# Patient Record
Sex: Female | Born: 1995
Health system: Southern US, Community
[De-identification: ages and names within clinical notes are randomized; demographics above are authoritative.]

## PROBLEM LIST (undated history)

## (undated) DIAGNOSIS — E282 Polycystic ovarian syndrome: Secondary | ICD-10-CM

## (undated) DIAGNOSIS — D649 Anemia, unspecified: Secondary | ICD-10-CM

## (undated) DIAGNOSIS — E079 Disorder of thyroid, unspecified: Secondary | ICD-10-CM

## (undated) DIAGNOSIS — E039 Hypothyroidism, unspecified: Secondary | ICD-10-CM

## (undated) HISTORY — DX: Disorder of thyroid, unspecified: E07.9

## (undated) HISTORY — DX: Polycystic ovarian syndrome: E28.2

## (undated) HISTORY — DX: Anemia, unspecified: D64.9

## (undated) HISTORY — PX: WISDOM TOOTH EXTRACTION: SHX21

---
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
## 2017-05-17 ENCOUNTER — Ambulatory Visit (INDEPENDENT_AMBULATORY_CARE_PROVIDER_SITE_OTHER): Payer: No Typology Code available for payment source | Admitting: Family Medicine

## 2017-05-17 ENCOUNTER — Encounter: Payer: Self-pay | Admitting: Family Medicine

## 2017-05-17 VITALS — BP 108/70 | HR 78 | Temp 98.4°F | Ht 64.0 in | Wt 192.2 lb

## 2017-05-17 DIAGNOSIS — Z114 Encounter for screening for human immunodeficiency virus [HIV]: Secondary | ICD-10-CM | POA: Diagnosis not present

## 2017-05-17 DIAGNOSIS — Z0000 Encounter for general adult medical examination without abnormal findings: Secondary | ICD-10-CM | POA: Diagnosis not present

## 2017-05-17 DIAGNOSIS — Z111 Encounter for screening for respiratory tuberculosis: Secondary | ICD-10-CM

## 2017-05-17 DIAGNOSIS — E039 Hypothyroidism, unspecified: Secondary | ICD-10-CM | POA: Diagnosis not present

## 2017-05-17 DIAGNOSIS — Z23 Encounter for immunization: Secondary | ICD-10-CM | POA: Diagnosis not present

## 2017-05-17 MED ORDER — DROSPIRENONE-ETHINYL ESTRADIOL 3-0.02 MG PO TABS: 1.0000 | ORAL_TABLET | Freq: Every day | ORAL | 3 refills | Status: DC

## 2017-05-17 NOTE — Progress Notes (Signed)
Pre visit review using our clinic review tool, if applicable. No additional management support is needed unless otherwise documented below in the visit note. 

## 2017-05-17 NOTE — Progress Notes (Signed)
Chief Complaint  Patient presents with  . Establish Care     Well Woman [place] Leah Howard is here for a complete physical.   Her last physical was >1 year ago.  Current diet: in general, a "healthy" diet. Current exercise: cardio, lifting. Weight is stable and she denies daytime fatigue. No LMP recorded.  Seatbelt? Yes  Health Maintenance Pap/HPV- Yes Tetanus- No HIV screening- No  STI- 1 year ago  Needs tb test for school.  Past Medical History:  Diagnosis Date  . PCOS (polycystic ovarian syndrome)      History reviewed. No pertinent surgical history.  Medications  Current Outpatient Medications on File Prior to Visit  Medication Sig Dispense Refill  . drospirenone-ethinyl estradiol (YAZ,GIANVI,LORYNA) 3-0.02 MG tablet Take 1 tablet by mouth daily.     Allergies No Known Allergies  Review of Systems: Constitutional:  no unexpected weight changes Eye:  no recent significant change in vision Ear/Nose/Mouth/Throat:  Ears:  no tinnitus or vertigo and no recent change in hearing Nose/Mouth/Throat:  no complaints of nasal congestion, no sore throat Cardiovascular: no chest pain Respiratory:  no cough and no shortness of breath Gastrointestinal:  no abdominal pain, no change in bowel habits GU:  Female: negative for dysuria or pelvic pain Musculoskeletal/Extremities: +L knee pain; otherwise no pain of the joints Integumentary (Skin/Breast):  no abnormal skin lesions reported Neurologic:  no headaches Endocrine:  denies fatigue Hematologic/Lymphatic:  No areas of easy bleeding  Exam BP 108/70 (BP Location: Left Arm, Patient Position: Sitting, Cuff Size: Normal)   Pulse 78   Temp 98.4 F (36.9 C) (Oral)   Ht  (1.626 m)   Wt 192 lb 4 oz (87.2 kg)   SpO2 98%   BMI 33.00 kg/m  General:  well developed, well nourished, in no apparent distress Skin:  no significant moles, warts, or growths Head:  no masses, lesions, or tenderness Eyes:  pupils equal and round,  sclera anicteric without injection Ears:  canals without lesions, TMs shiny without retraction, no obvious effusion, no erythema Nose:  nares patent, septum midline, mucosa normal, and no drainage or sinus tenderness Throat/Pharynx:  lips and gingiva without lesion; tongue and uvula midline; non-inflamed pharynx; no exudates or postnasal drainage Neck: neck supple without adenopathy, thyromegaly, or masses Lungs:  clear to auscultation, breath sounds equal bilaterally, no respiratory distress Cardio:  regular rate and rhythm, no bruits, no LE edema Abdomen:  abdomen soft, nontender; bowel sounds normal; no masses or organomegaly Genital: Defer to GYN Musculoskeletal:  symmetrical muscle groups noted without atrophy or deformity Extremities:  no clubbing, cyanosis, or edema, no deformities, no skin discoloration Neuro:  gait normal; deep tendon reflexes normal and symmetric Psych: well oriented with normal range of affect and appropriate judgment/insight  Assessment and Plan  Well adult exam - Plan: CBC, Comprehensive metabolic panel, Lipid panel  Screening-pulmonary TB - Plan: QuantiFERON-TB Gold Plus  Screening for HIV (human immunodeficiency virus) - Plan: HIV antibody  Hypothyroidism, unspecified type - Plan: TSH, T4, free  Need for tetanus booster - Plan: Tdap vaccine greater than or equal to 7yo IM   Well 22 y.o. female. Counseled on diet and exercise. She follows with GYN for her women's health. Will have STI screening through them. Other orders as above. Follow up in 1 yr or prn pending above. The patient voiced understanding and agreement to the plan.  Jilda Roche [place], DO 05/17/17 5:07 PM

## 2017-05-17 NOTE — Patient Instructions (Signed)
Keep the diet clean.  Stay physically active.  Come to your labs in 3 weeks fasting.   We will be in touch regarding the results.   If you need a refill of the thyroid medication, let me know.   Let us know if you need anything.

## 2017-05-18 ENCOUNTER — Other Ambulatory Visit: Payer: Self-pay | Admitting: Family Medicine

## 2017-05-19 ENCOUNTER — Encounter: Payer: Self-pay | Admitting: Family Medicine

## 2017-05-19 LAB — QUANTIFERON-TB GOLD PLUS
Mitogen-NIL: >10 [IU]/mL
NIL: 0.03 [IU]/mL
QuantiFERON-TB Gold Plus: NEGATIVE
TB1-NIL: <0 [IU]/mL
TB2-NIL: 0.01 [IU]/mL

## 2017-05-22 ENCOUNTER — Other Ambulatory Visit: Payer: Self-pay | Admitting: Family Medicine

## 2017-05-23 ENCOUNTER — Other Ambulatory Visit: Payer: Self-pay | Admitting: Family Medicine

## 2017-05-24 MED ORDER — LEVOTHYROXINE SODIUM 25 MCG PO TABS: 25.0000 ug | ORAL_TABLET | Freq: Every day | ORAL | 1 refills | Status: DC

## 2017-05-24 NOTE — Telephone Encounter (Signed)
Called and informed the patient of her results/she will pickup copy in the morning at our office.

## 2017-05-24 NOTE — Telephone Encounter (Signed)
Copied from CRM [id number]. Topic: Quick Communication - Other Results >> May 24, 2017  1:27 PM Leafy Ro wrote: Pt is calling would like tb blood test results

## 2017-05-24 NOTE — Telephone Encounter (Signed)
Called left message to call back 

## 2017-05-24 NOTE — Telephone Encounter (Signed)
Copied from CRM #[id number]. Topic: Quick Communication - Other Results >> May 24, 2017  1:27 PM Robinson, Norma J wrote: Pt is calling would like tb blood test results 

## 2017-05-24 NOTE — Telephone Encounter (Signed)
Negative/normal. TY.

## 2017-06-08 ENCOUNTER — Encounter: Payer: Self-pay | Admitting: Family Medicine

## 2017-06-08 ENCOUNTER — Other Ambulatory Visit (INDEPENDENT_AMBULATORY_CARE_PROVIDER_SITE_OTHER): Payer: No Typology Code available for payment source

## 2017-06-08 ENCOUNTER — Other Ambulatory Visit: Payer: No Typology Code available for payment source

## 2017-06-08 DIAGNOSIS — Z114 Encounter for screening for human immunodeficiency virus [HIV]: Secondary | ICD-10-CM

## 2017-06-08 DIAGNOSIS — Z0000 Encounter for general adult medical examination without abnormal findings: Secondary | ICD-10-CM | POA: Diagnosis not present

## 2017-06-08 DIAGNOSIS — E039 Hypothyroidism, unspecified: Secondary | ICD-10-CM | POA: Diagnosis not present

## 2017-06-08 LAB — TSH: TSH: 6.9 u[IU]/mL — ABNORMAL HIGH (ref 0.35–4.50)

## 2017-06-08 LAB — COMPREHENSIVE METABOLIC PANEL WITH GFR
ALT: 17 U/L (ref 0–35)
AST: 21 U/L (ref 0–37)
Albumin: 4.3 g/dL (ref 3.5–5.2)
Alkaline Phosphatase: 46 U/L (ref 39–117)
BUN: 13 mg/dL (ref 6–23)
CO2: 25 meq/L (ref 19–32)
Calcium: 9.2 mg/dL (ref 8.4–10.5)
Chloride: 105 meq/L (ref 96–112)
Creatinine, Ser: 0.72 mg/dL (ref 0.40–1.20)
GFR: 107.08 mL/min (ref >60.00)
Glucose, Bld: 89 mg/dL (ref 70–99)
Potassium: 4.5 meq/L (ref 3.5–5.1)
Sodium: 139 meq/L (ref 135–145)
Total Bilirubin: 0.3 mg/dL (ref 0.2–1.2)
Total Protein: 7.2 g/dL (ref 6.0–8.3)

## 2017-06-08 LAB — LIPID PANEL
Cholesterol: 170 mg/dL (ref 0–200)
HDL: 73.7 mg/dL (ref >39.00)
LDL Cholesterol: 71 mg/dL (ref 0–99)
NonHDL: 95.96
Total CHOL/HDL Ratio: 2
Triglycerides: 123 mg/dL (ref 0.0–149.0)
VLDL: 24.6 mg/dL (ref 0.0–40.0)

## 2017-06-08 LAB — CBC
HCT: 36.7 % (ref 36.0–46.0)
Hemoglobin: 12.5 g/dL (ref 12.0–15.0)
MCHC: 34.2 g/dL (ref 30.0–36.0)
MCV: 84.2 fl (ref 78.0–100.0)
Platelets: 204 K/uL (ref 150.0–400.0)
RBC: 4.36 Mil/uL (ref 3.87–5.11)
RDW: 12.7 % (ref 11.5–15.5)
WBC: 7.3 K/uL (ref 4.0–10.5)

## 2017-06-08 LAB — T4, FREE: Free T4: 0.72 ng/dL (ref 0.60–1.60)

## 2017-06-08 LAB — HIV ANTIBODY (ROUTINE TESTING W REFLEX): HIV 1&2 Ab, 4th Generation: NONREACTIVE

## 2017-06-09 ENCOUNTER — Encounter: Payer: Self-pay | Admitting: Family Medicine

## 2017-06-18 ENCOUNTER — Encounter: Payer: Self-pay | Admitting: Obstetrics and Gynecology

## 2017-06-18 ENCOUNTER — Ambulatory Visit (INDEPENDENT_AMBULATORY_CARE_PROVIDER_SITE_OTHER): Payer: No Typology Code available for payment source | Admitting: Obstetrics and Gynecology

## 2017-06-18 VITALS — BP 119/64 | HR 72 | Ht 64.0 in | Wt 193.0 lb

## 2017-06-18 DIAGNOSIS — E282 Polycystic ovarian syndrome: Secondary | ICD-10-CM | POA: Diagnosis not present

## 2017-06-18 DIAGNOSIS — Z124 Encounter for screening for malignant neoplasm of cervix: Secondary | ICD-10-CM

## 2017-06-18 DIAGNOSIS — Z01419 Encounter for gynecological examination (general) (routine) without abnormal findings: Secondary | ICD-10-CM | POA: Diagnosis not present

## 2017-06-18 DIAGNOSIS — Z113 Encounter for screening for infections with a predominantly sexual mode of transmission: Secondary | ICD-10-CM

## 2017-06-18 DIAGNOSIS — Z3189 Encounter for other procreative management: Secondary | ICD-10-CM | POA: Diagnosis not present

## 2017-06-18 NOTE — Progress Notes (Signed)
GYNECOLOGY ANNUAL PREVENTATIVE CARE ENCOUNTER NOTE  Subjective:   Leah Howard is a 22 y.o. G0P0000 female here for a annual gynecologic exam. Current complaints: wondering how her contraception affects her PCOS, would like to discuss fertility planning.  Having monthly periods, bleeds 3-4 days, very light.  Denies  discharge, pelvic pain, or other gynecologic concerns. She would like to conceive in 1-2 years.   Gynecologic History Patient's last menstrual period was 05/25/2017. Contraception: OCP (estrogen/progesterone) Last Pap: 2 years ago. Results were: normal Last mammogram: n/a  Obstetric History OB History  Gravida Para Term Preterm AB Living  0 0 0 0 0 0  SAB TAB Ectopic Multiple Live Births  0 0 0 0 0    Past Medical History:  Diagnosis Date  . Anemia   . PCOS (polycystic ovarian syndrome)   . PCOS (polycystic ovarian syndrome)   . Thyroid disease     Past Surgical History:  Procedure Laterality Date  . WISDOM TOOTH EXTRACTION      Current Outpatient Medications on File Prior to Visit  Medication Sig Dispense Refill  . drospirenone-ethinyl estradiol (YAZ,GIANVI,LORYNA) 3-0.02 MG tablet Take 1 tablet by mouth daily. 3 Package 3  . levothyroxine (SYNTHROID, LEVOTHROID) 25 MCG tablet Take 1 tablet (25 mcg total) by mouth daily before breakfast. 90 tablet 1   No current facility-administered medications on file prior to visit.     No Known Allergies  Social History   Socioeconomic History  . Marital status: Single    Spouse name: Not on file  . Number of children: Not on file  . Years of education: Not on file  . Highest education level: Not on file  Occupational History  . Not on file  Social Needs  . Financial resource strain: Not on file  . Food insecurity:    Worry: Not on file    Inability: Not on file  . Transportation needs:    Medical: Not on file    Non-medical: Not on file  Tobacco Use  . Smoking status: Never Smoker  . Smokeless  tobacco: Never Used  Substance and Sexual Activity  . Alcohol use: Never    Frequency: Never  . Drug use: Never  . Sexual activity: Yes    Birth control/protection: Pill  Lifestyle  . Physical activity:    Days per week: Not on file    Minutes per session: Not on file  . Stress: Not on file  Relationships  . Social connections:    Talks on phone: Not on file    Gets together: Not on file    Attends religious service: Not on file    Active member of club or organization: Not on file    Attends meetings of clubs or organizations: Not on file    Relationship status: Not on file  . Intimate partner violence:    Fear of current or ex partner: Not on file    Emotionally abused: Not on file    Physically abused: Not on file    Forced sexual activity: Not on file  Other Topics Concern  . Not on file  Social History Narrative  . Not on file    Family History  Problem Relation Age of Onset  . Hyperlipidemia Father   . Diabetes Paternal Grandmother   . Arthritis Paternal Grandfather   . Diabetes Paternal Grandfather     Diet: improved, patient trying to lose weight Exercise: does exercise classes 3x/week at gym  The following  portions of the patient's history were reviewed and updated as appropriate: allergies, current medications, past family history, past medical history, past social history, past surgical history and problem list.  Review of Systems Pertinent items are noted in HPI.   Objective:  BP 119/64   Pulse 72   Ht 5\' 4"  (1.626 m)   Wt 193 lb (87.5 kg)   LMP 05/25/2017   BMI 33.13 kg/m  CONSTITUTIONAL: Well-developed, well-nourished female in no acute distress.  HENT:  Normocephalic, atraumatic, External right and left ear normal. Oropharynx is clear and moist EYES: Conjunctivae and EOM are normal. Pupils are equal, round, and reactive to light. No scleral icterus.  NECK: Normal range of motion, supple, no masses.  SKIN: Skin is warm and dry. No rash noted.  Not diaphoretic. No erythema. No pallor. NEUROLOGIC: Alert and oriented to person, place, and time. Normal reflexes, muscle tone coordination. No cranial nerve deficit noted. PSYCHIATRIC: Normal mood and affect. Normal behavior. Normal judgment and thought content. CARDIOVASCULAR: Normal heart rate noted, regular rhythm RESPIRATORY: Clear to auscultation bilaterally. Effort and breath sounds normal, no problems with respiration noted. BREASTS: Symmetric in size. No masses, skin changes, nipple drainage, or lymphadenopathy. ABDOMEN: Soft, normal bowel sounds, no distention noted.  No tenderness, rebound or guarding.  PELVIC: Normal appearing external genitalia; normal appearing vaginal mucosa and cervix.  No abnormal discharge noted.  Pap smear obtained.  Normal uterine size, no other palpable masses, no uterine or adnexal tenderness. MUSCULOSKELETAL: Normal range of motion. No tenderness.  No cyanosis, clubbing, or edema.  2+ distal pulses.   Assessment and Plan:   1. Well woman exam Routine exam - Cytology - PAP  2. PCOS (polycystic ovarian syndrome) Cont CHC until attempts to conceive, then may dc Reviewed it may take longer for her to achieve pregnancy and once she starts trying, if no conception within 1 year, to seek medical assistance  3. Encounter for fertility planning Reviewed importance of prenatal vitamins, avoiding alcohol, tobacco products Reviewed importance of maintaining healthy weight, she is actively attempting to lose weight, has done well and lost 6 pounds since beginning of year, encouraged her to continue   Will follow up results of pap smear/STI screen and manage accordingly. Encouraged improvement in diet and exercise.    Routine preventative health maintenance measures emphasized. Please refer to After Visit Summary for other counseling recommendations.     Baldemar LenisK. Meryl Raye Wiens, M.D. Attending Obstetrician & Gynecologist, [facility], [facility]

## 2017-06-21 LAB — CYTOLOGY - PAP
Chlamydia: NEGATIVE
Diagnosis: NEGATIVE
Neisseria Gonorrhea: NEGATIVE

## 2017-06-23 MED FILL — LEVOTHYROXINE 25 MCG TABLET: 25 | 90 days supply | Qty: 90 | Fill #0

## 2017-07-23 ENCOUNTER — Encounter: Payer: Self-pay | Admitting: Obstetrics and Gynecology

## 2017-09-27 ENCOUNTER — Ambulatory Visit (INDEPENDENT_AMBULATORY_CARE_PROVIDER_SITE_OTHER): Payer: No Typology Code available for payment source

## 2017-09-27 DIAGNOSIS — Z23 Encounter for immunization: Secondary | ICD-10-CM

## 2017-12-14 ENCOUNTER — Ambulatory Visit (INDEPENDENT_AMBULATORY_CARE_PROVIDER_SITE_OTHER): Payer: No Typology Code available for payment source | Admitting: Obstetrics & Gynecology

## 2017-12-14 ENCOUNTER — Encounter: Payer: Self-pay | Admitting: Obstetrics & Gynecology

## 2017-12-14 VITALS — BP 125/77 | HR 80 | Wt 187.1 lb

## 2017-12-14 DIAGNOSIS — Z8742 Personal history of other diseases of the female genital tract: Secondary | ICD-10-CM

## 2017-12-14 DIAGNOSIS — Z3400 Encounter for supervision of normal first pregnancy, unspecified trimester: Secondary | ICD-10-CM

## 2017-12-14 DIAGNOSIS — Z3401 Encounter for supervision of normal first pregnancy, first trimester: Secondary | ICD-10-CM | POA: Diagnosis not present

## 2017-12-14 DIAGNOSIS — Z113 Encounter for screening for infections with a predominantly sexual mode of transmission: Secondary | ICD-10-CM

## 2017-12-14 LAB — POCT URINALYSIS DIPSTICK OB
Bilirubin, UA: NEGATIVE
Blood, UA: NEGATIVE
Glucose, UA: NEGATIVE
Ketones, UA: NEGATIVE
Leukocytes, UA: NEGATIVE
Nitrite, UA: NEGATIVE
POC,PROTEIN,UA: NEGATIVE
Spec Grav, UA: 1.015 (ref 1.010–1.025)
pH, UA: 6.5 (ref 5.0–8.0)

## 2017-12-14 NOTE — Patient Instructions (Signed)
First Trimester of Pregnancy The first trimester of pregnancy is from week 1 until the end of week 13 (months 1 through 3). During this time, your baby will begin to develop inside you. At 6-8 weeks, the eyes and face are formed, and the heartbeat can be seen on ultrasound. At the end of 12 weeks, all the baby's organs are formed. Prenatal care is all the medical care you receive before the birth of your baby. Make sure you get good prenatal care and follow all of your doctor's instructions. Follow these instructions at home: Medicines  Take over-the-counter and prescription medicines only as told by your doctor. Some medicines are safe and some medicines are not safe during pregnancy.  Take a prenatal vitamin that contains at least 600 micrograms (mcg) of folic acid.  If you have trouble pooping (constipation), take medicine that will make your stool soft (stool softener) if your doctor approves. Eating and drinking  Eat regular, healthy meals.  Your doctor will tell you the amount of weight gain that is right for you.  Avoid raw meat and uncooked cheese.  If you feel sick to your stomach (nauseous) or throw up (vomit): ? Eat 4 or 5 small meals a day instead of 3 large meals. ? Try eating a few soda crackers. ? Drink liquids between meals instead of during meals.  To prevent constipation: ? Eat foods that are high in fiber, like fresh fruits and vegetables, whole grains, and beans. ? Drink enough fluids to keep your pee (urine) clear or pale yellow. Activity  Exercise only as told by your doctor. Stop exercising if you have cramps or pain in your lower belly (abdomen) or low back.  Do not exercise if it is too hot, too humid, or if you are in a place of great height (high altitude).  Try to avoid standing for long periods of time. Move your legs often if you must stand in one place for a long time.  Avoid heavy lifting.  Wear low-heeled shoes. Sit and stand up straight.  You  can have sex unless your doctor tells you not to. Relieving pain and discomfort  Wear a good support bra if your breasts are sore.  Take warm water baths (sitz baths) to soothe pain or discomfort caused by hemorrhoids. Use hemorrhoid cream if your doctor says it is okay.  Rest with your legs raised if you have leg cramps or low back pain.  If you have puffy, bulging veins (varicose veins) in your legs: ? Wear support hose or compression stockings as told by your doctor. ? Raise (elevate) your feet for 15 minutes, 3-4 times a day. ? Limit salt in your food. Prenatal care  Schedule your prenatal visits by the twelfth week of pregnancy.  Write down your questions. Take them to your prenatal visits.  Keep all your prenatal visits as told by your doctor. This is important. Safety  Wear your seat belt at all times when driving.  Make a list of emergency phone numbers. The list should include numbers for family, friends, the hospital, and police and fire departments. General instructions  Ask your doctor for a referral to a local prenatal class. Begin classes no later than at the start of month 6 of your pregnancy.  Ask for help if you need counseling or if you need help with nutrition. Your doctor can give you advice or tell you where to go for help.  Do not use hot tubs, steam rooms, or   saunas.  Do not douche or use tampons or scented sanitary pads.  Do not cross your legs for long periods of time.  Avoid all herbs and alcohol. Avoid drugs that are not approved by your doctor.  Do not use any tobacco products, including cigarettes, chewing tobacco, and electronic cigarettes. If you need help quitting, ask your doctor. You may get counseling or other support to help you quit.  Avoid cat litter boxes and soil used by cats. These carry germs that can cause birth defects in the baby and can cause a loss of your baby (miscarriage) or stillbirth.  Visit your dentist. At home, brush  your teeth with a soft toothbrush. Be gentle when you floss. Contact a doctor if:  You are dizzy.  You have mild cramps or pressure in your lower belly.  You have a nagging pain in your belly area.  You continue to feel sick to your stomach, you throw up, or you have watery poop (diarrhea).  You have a bad smelling fluid coming from your vagina.  You have pain when you pee (urinate).  You have increased puffiness (swelling) in your face, hands, legs, or ankles. Get help right away if:  You have a fever.  You are leaking fluid from your vagina.  You have spotting or bleeding from your vagina.  You have very bad belly cramping or pain.  You gain or lose weight rapidly.  You throw up blood. It may look like coffee grounds.  You are around people who have German measles, fifth disease, or chickenpox.  You have a very bad headache.  You have shortness of breath.  You have any kind of trauma, such as from a fall or a car accident. Summary  The first trimester of pregnancy is from week 1 until the end of week 13 (months 1 through 3).  To take care of yourself and your unborn baby, you will need to eat healthy meals, take medicines only if your doctor tells you to do so, and do activities that are safe for you and your baby.  Keep all follow-up visits as told by your doctor. This is important as your doctor will have to ensure that your baby is healthy and growing well. This information is not intended to replace advice given to you by your health care provider. Make sure you discuss any questions you have with your health care provider. Document Released: 06/17/2007 Document Revised: 01/07/2016 Document Reviewed: 01/07/2016 Elsevier Interactive Patient Education  2017 [employer]  

## 2017-12-14 NOTE — Progress Notes (Signed)
DATING AND VIABILITY SONOGRAM   Leah Howard is a 22 y.o. year old G1P0000 with LMP Patient's last menstrual period was 10/14/2017 (exact date). which would correlate to  5262w5d weeks gestation.  She has regular menstrual cycles.   She is here today for a confirmatory initial sonogram.    GESTATION: SINGLETON     FETAL ACTIVITY:          Heart rate      156          The fetus is active.    ADNEXA: The ovaries are normal.   GESTATIONAL AGE AND  BIOMETRICS:  Gestational criteria: Estimated Date of Delivery: 07/21/18 by LMP now at 9762w5d  Previous Scans:0  GESTATIONAL SAC           3.24 cm         8-3 weeks  CROWN RUMP LENGTH           1.93 cm        8-3 weeks                                                                               AVERAGE EGA(BY THIS SCAN): 8-3 weeks  WORKING EDD( early ultrasound ):  07-21-2018     TECHNICIAN COMMENTS: Patient informed that the ultrasound is considered a limited obstetric ultrasound and is not intended to be a complete ultrasound exam. Patient also informed that the ultrasound is not being completed with the intent of assessing for fetal or placental anomalies or any pelvic abnormalities. Explained that the purpose of today's ultrasound is to assess for fetal heart rate. Patient acknowledges the purpose of the exam and the limitations of the study.   Armandina StammerJennifer Seann Genther 12/14/2017 9:19 AM

## 2017-12-14 NOTE — Progress Notes (Signed)
  Subjective:    Leah Howard is being seen today for her first obstetrical visit.LMP 10-14-2017. She was on OCPs until Sept and and Levothyroxine until July to regulate her cycles. Has reg cycles after stopping meds.   This is a planned pregnancy. She is at 4932w5d gestation. Her obstetrical history is significant for PCOS. Relationship with FOB: significant other, living together. Patient does intend to breast feed. Pregnancy history fully reviewed.  Patient reports nausea and mild nausea. Mild constipation.  .  Review of Systems:   Review of Systems  Objective:     BP 125/77   Pulse 80   Wt 187 lb 1.3 oz (84.9 kg)   LMP 10/14/2017 (Exact Date)   BMI 32.11 kg/m  Physical Exam  Exam General Appearance:    Alert, cooperative, no distress, appears stated age  Head:    Normocephalic, without obvious abnormality, atraumatic  Eyes:    conjunctiva/corneas clear, EOM's intact, both eyes  Ears:    Normal external ear canals, both ears  Nose:   Nares normal, septum midline, mucosa normal, no drainage    or sinus tenderness  Throat:   Lips, mucosa, and tongue normal; teeth and gums normal  Neck:   Supple, symmetrical, trachea midline, no adenopathy;    thyroid:  no enlargement/tenderness/nodules  Back:     Symmetric, no curvature, ROM normal, no CVA tenderness  Lungs:     Clear to auscultation bilaterally, respirations unlabored  Chest Wall:    No tenderness or deformity   Heart:    Regular rate and rhythm, S1 and S2 normal, no murmur, rub   or gallop  Breast Exam:    No tenderness, masses, or nipple abnormality  Abdomen:     Soft, non-tender, bowel sounds active all four quadrants,    no masses, no organomegaly  Genitalia:    Normal female without lesion, discharge or tenderness; 8-9 weeks sized     Extremities:   Extremities normal, atraumatic, no cyanosis or edema  Pulses:   2+ and symmetric all extremities  Skin:   Skin color, texture, turgor normal, no rashes or lesions    Assessment:    Pregnancy: G1P0000 Patient Active Problem List   Diagnosis Date Noted  . Supervision of normal first pregnancy, antepartum 12/14/2017  h/o PCOS     Plan:     Initial labs drawn. Prenatal vitamins. Problem list reviewed and updated. AFP3 discussed: requested. Role of ultrasound in pregnancy discussed; fetal survey: requested. Amniocentesis discussed: not indicated. Follow up in 4 weeks. 60% of 40 min visit spent on counseling and coordination of care.    Leah RosenthalCarolyn Howard 12/14/2017

## 2017-12-15 LAB — GC/CHLAMYDIA PROBE AMP (~~LOC~~) NOT AT ARMC
Chlamydia: NEGATIVE
Neisseria Gonorrhea: NEGATIVE

## 2017-12-16 LAB — CULTURE, OB URINE

## 2017-12-16 LAB — URINE CULTURE, OB REFLEX: Organism ID, Bacteria: NO GROWTH

## 2017-12-22 LAB — OBSTETRIC PANEL, INCLUDING HIV
Antibody Screen: NEGATIVE
Basophils Absolute: 0 x10E3/uL (ref 0.0–0.2)
Basos: 0 %
EOS (ABSOLUTE): 0.1 x10E3/uL (ref 0.0–0.4)
Eos: 1 %
HIV Screen 4th Generation wRfx: NONREACTIVE
Hematocrit: 36.2 % (ref 34.0–46.6)
Hemoglobin: 12.4 g/dL (ref 11.1–15.9)
Hepatitis B Surface Ag: NEGATIVE
Immature Grans (Abs): 0.1 x10E3/uL (ref 0.0–0.1)
Immature Granulocytes: 1 %
Lymphocytes Absolute: 2.1 x10E3/uL (ref 0.7–3.1)
Lymphs: 20 %
MCH: 28.2 pg (ref 26.6–33.0)
MCHC: 34.3 g/dL (ref 31.5–35.7)
MCV: 83 fL (ref 79–97)
Monocytes Absolute: 0.6 x10E3/uL (ref 0.1–0.9)
Monocytes: 6 %
Neutrophils Absolute: 7.7 x10E3/uL — ABNORMAL HIGH (ref 1.4–7.0)
Neutrophils: 72 %
Platelets: 206 x10E3/uL (ref 150–450)
RBC: 4.39 x10E6/uL (ref 3.77–5.28)
RDW: 12.6 % (ref 12.3–15.4)
RPR Ser Ql: NONREACTIVE
Rh Factor: POSITIVE
Rubella Antibodies, IGG: 1 {index} (ref >0.99)
WBC: 10.6 x10E3/uL (ref 3.4–10.8)

## 2017-12-22 LAB — SMN1 COPY NUMBER ANALYSIS (SMA CARRIER SCREENING)

## 2017-12-22 LAB — HEMOGLOBINOPATHY EVALUATION
Ferritin: 41 ng/mL (ref 15–150)
Hgb A2 Quant: 2.5 % (ref 1.8–3.2)
Hgb A: 97.5 % (ref 96.4–98.8)
Hgb C: 0 %
Hgb F Quant: 0 % (ref 0.0–2.0)
Hgb S: 0 %
Hgb Solubility: NEGATIVE
Hgb Variant: 0 %

## 2018-01-11 ENCOUNTER — Ambulatory Visit (INDEPENDENT_AMBULATORY_CARE_PROVIDER_SITE_OTHER): Payer: No Typology Code available for payment source | Admitting: Obstetrics and Gynecology

## 2018-01-11 VITALS — BP 127/75 | HR 92 | Wt 189.0 lb

## 2018-01-11 DIAGNOSIS — Z8742 Personal history of other diseases of the female genital tract: Secondary | ICD-10-CM

## 2018-01-11 DIAGNOSIS — Z3401 Encounter for supervision of normal first pregnancy, first trimester: Secondary | ICD-10-CM

## 2018-01-11 DIAGNOSIS — Z3400 Encounter for supervision of normal first pregnancy, unspecified trimester: Secondary | ICD-10-CM

## 2018-01-11 NOTE — Progress Notes (Signed)
   PRENATAL VISIT NOTE  Subjective:  Leah Howard is a 22 y.o. G1P0000 at 3550w5d being seen today for ongoing prenatal care.  She is currently monitored for the following issues for this low-risk pregnancy and has Supervision of normal first pregnancy, antepartum on their problem list.  Patient reports nausea. No complaints of H/A, but wants to know what she can take, if she were to get a H/A.  Contractions: Not present. Vag. Bleeding: None.   . Denies leaking of fluid.   The following portions of the patient's history were reviewed and updated as appropriate: allergies, current medications, past family history, past medical history, past social history, past surgical history and problem list. Problem list updated.  Objective:   Vitals:   01/11/18 0830  BP: 127/75  Pulse: 92  Weight: 189 lb (85.7 kg)    Fetal Status: Fetal Heart Rate (bpm): 158 S=D       General:  Alert, oriented and cooperative. Patient is in no acute distress.  Skin: Skin is warm and dry. No rash noted.   Cardiovascular: Normal heart rate noted  Respiratory: Normal respiratory effort, no problems with respiration noted  Abdomen: Soft, gravid, appropriate for gestational age.  Pain/Pressure: Absent     Pelvic: Cervical exam deferred        Extremities: Normal range of motion.  Edema: None  Mental Status: Normal mood and affect. Normal behavior. Normal judgment and thought content.   Assessment and Plan:  Pregnancy: G1P0000 at 5750w5d  1. Supervision of normal first pregnancy, antepartum - Discussed it is safe to take Tylenol 1000 mg po every 6 hrs prn pain - Advised that if H/A begin to be a problem and minimal relief achieved with Tylenol, let provider know so we can Rx something specific for H/A and stronger than Tylenol - US MFM OB DETAIL +14 WK; Future in 6 wks  2. History of PCOS - US MFM OB DETAIL +14 WK; Future  Preterm labor symptoms and general obstetric precautions including but not limited to  vaginal bleeding, contractions, leaking of fluid and fetal movement were reviewed in detail with the patient. Please refer to After Visit Summary for other counseling recommendations.  Return in about 4 weeks (around 02/08/2018) for Return OB visit.  Future Appointments  Date Time Provider Department Center  02/14/2018  8:15 AM Levie HeritageStinson, Jacob J, DO [facility] None  05/20/2018  8:00 AM Wendling, Jilda RocheNicholas Paul, DO [facility]    Raelyn Moraolitta Amanda Pote, CNM

## 2018-02-14 ENCOUNTER — Ambulatory Visit (INDEPENDENT_AMBULATORY_CARE_PROVIDER_SITE_OTHER): Payer: No Typology Code available for payment source | Admitting: Family Medicine

## 2018-02-14 VITALS — BP 125/65 | HR 85 | Wt 199.0 lb

## 2018-02-14 DIAGNOSIS — E039 Hypothyroidism, unspecified: Secondary | ICD-10-CM

## 2018-02-14 DIAGNOSIS — Z3402 Encounter for supervision of normal first pregnancy, second trimester: Secondary | ICD-10-CM

## 2018-02-14 DIAGNOSIS — E038 Other specified hypothyroidism: Secondary | ICD-10-CM

## 2018-02-14 DIAGNOSIS — Z3400 Encounter for supervision of normal first pregnancy, unspecified trimester: Secondary | ICD-10-CM

## 2018-02-14 NOTE — Progress Notes (Signed)
   PRENATAL VISIT NOTE  Subjective:  Leah Howard is a 23 y.o. G1P0000 at 4527w4d being seen today for ongoing prenatal care.  She is currently monitored for the following issues for this low-risk pregnancy and has Supervision of normal first pregnancy, antepartum on their problem list.  Patient reports no complaints.  Contractions: Not present. Vag. Bleeding: None.  Movement: Present. Denies leaking of fluid.   The following portions of the patient's history were reviewed and updated as appropriate: allergies, current medications, past family history, past medical history, past social history, past surgical history and problem list. Problem list updated.  Objective:   Vitals:   02/14/18 0821  BP: 125/65  Pulse: 85  Weight: 199 lb (90.3 kg)    Fetal Status: Fetal Heart Rate (bpm): 144   Movement: Present     General:  Alert, oriented and cooperative. Patient is in no acute distress.  Skin: Skin is warm and dry. No rash noted.   Cardiovascular: Normal heart rate noted  Respiratory: Normal respiratory effort, no problems with respiration noted  Abdomen: Soft, gravid, appropriate for gestational age.  Pain/Pressure: Absent     Pelvic: Cervical exam deferred        Extremities: Normal range of motion.  Edema: None  Mental Status: Normal mood and affect. Normal behavior. Normal judgment and thought content.   Assessment and Plan:  Pregnancy: G1P0000 at 8227w4d  1. Supervision of normal first pregnancy, antepartum FHT and FH normal  2. Subclinical hypothyroidism Based on tests in July, patient has subclinical hypothyroidism. Discussed possibility of needing treatment. Will recheck labs today (it looks like the labs were ordered with her initial OB, but weren't drawn). - TSH - T4, free  Preterm labor symptoms and general obstetric precautions including but not limited to vaginal bleeding, contractions, leaking of fluid and fetal movement were reviewed in detail with the  patient. Please refer to After Visit Summary for other counseling recommendations.  No follow-ups on file.  Future Appointments  Date Time Provider Department Center  02/25/2018  7:45 AM [facility] US 2 [facility]  03/14/2018  8:15 AM Willodean RosenthalHarraway-Smith, Carolyn, MD [facility] None  05/20/2018  8:00 AM Wendling, Jilda RocheNicholas Paul, DO [facility]    Levie HeritageJacob J Fujiko Picazo, DO

## 2018-02-15 LAB — TSH: TSH: 5.79 u[IU]/mL — ABNORMAL HIGH (ref 0.450–4.500)

## 2018-02-15 LAB — T4, FREE: Free T4: 0.88 ng/dL (ref 0.82–1.77)

## 2018-02-16 ENCOUNTER — Encounter: Payer: Self-pay | Admitting: Family Medicine

## 2018-02-16 DIAGNOSIS — E039 Hypothyroidism, unspecified: Secondary | ICD-10-CM | POA: Insufficient documentation

## 2018-02-16 DIAGNOSIS — E038 Other specified hypothyroidism: Secondary | ICD-10-CM | POA: Insufficient documentation

## 2018-02-16 MED ORDER — LEVOTHYROXINE SODIUM 75 MCG PO TABS: 75.0000 ug | ORAL_TABLET | Freq: Every day | ORAL | 1 refills | Status: DC

## 2018-02-16 NOTE — Addendum Note (Signed)
Addended by: Levie Heritage on: 02/16/2018 06:16 AM   Modules accepted: Orders

## 2018-02-18 ENCOUNTER — Encounter (HOSPITAL_COMMUNITY): Payer: Self-pay

## 2018-02-23 ENCOUNTER — Encounter (HOSPITAL_COMMUNITY): Payer: Self-pay

## 2018-02-24 ENCOUNTER — Other Ambulatory Visit (HOSPITAL_COMMUNITY): Payer: No Typology Code available for payment source

## 2018-02-25 ENCOUNTER — Ambulatory Visit (HOSPITAL_COMMUNITY)
Admission: RE | Admit: 2018-02-25 | Discharge: 2018-02-25 | Disposition: A | Discharge status: Home / Self Care | Payer: No Typology Code available for payment source | Source: Ambulatory Visit | Attending: Obstetrics and Gynecology | Admitting: Obstetrics and Gynecology

## 2018-02-25 ENCOUNTER — Other Ambulatory Visit (HOSPITAL_COMMUNITY): Payer: Self-pay | Admitting: *Deleted

## 2018-02-25 ENCOUNTER — Encounter (HOSPITAL_COMMUNITY): Payer: Self-pay

## 2018-02-25 DIAGNOSIS — Z3A19 19 weeks gestation of pregnancy: Secondary | ICD-10-CM

## 2018-02-25 DIAGNOSIS — O99212 Obesity complicating pregnancy, second trimester: Secondary | ICD-10-CM

## 2018-02-25 DIAGNOSIS — O99282 Endocrine, nutritional and metabolic diseases complicating pregnancy, second trimester: Secondary | ICD-10-CM

## 2018-02-25 DIAGNOSIS — Z8742 Personal history of other diseases of the female genital tract: Secondary | ICD-10-CM | POA: Insufficient documentation

## 2018-02-25 DIAGNOSIS — O2692 Pregnancy related conditions, unspecified, second trimester: Secondary | ICD-10-CM | POA: Diagnosis not present

## 2018-02-25 DIAGNOSIS — E039 Hypothyroidism, unspecified: Secondary | ICD-10-CM | POA: Diagnosis not present

## 2018-02-25 DIAGNOSIS — Z3400 Encounter for supervision of normal first pregnancy, unspecified trimester: Secondary | ICD-10-CM | POA: Diagnosis present

## 2018-02-25 DIAGNOSIS — Z363 Encounter for antenatal screening for malformations: Secondary | ICD-10-CM

## 2018-02-25 HISTORY — DX: Hypothyroidism, unspecified: E03.9

## 2018-03-14 ENCOUNTER — Encounter: Payer: Self-pay | Admitting: Obstetrics & Gynecology

## 2018-03-14 ENCOUNTER — Ambulatory Visit (INDEPENDENT_AMBULATORY_CARE_PROVIDER_SITE_OTHER): Payer: No Typology Code available for payment source | Admitting: Obstetrics & Gynecology

## 2018-03-14 VITALS — BP 137/73 | HR 89 | Wt 197.0 lb

## 2018-03-14 DIAGNOSIS — Z3A21 21 weeks gestation of pregnancy: Secondary | ICD-10-CM

## 2018-03-14 DIAGNOSIS — Z3400 Encounter for supervision of normal first pregnancy, unspecified trimester: Secondary | ICD-10-CM

## 2018-03-14 DIAGNOSIS — Z3402 Encounter for supervision of normal first pregnancy, second trimester: Secondary | ICD-10-CM

## 2018-03-14 DIAGNOSIS — E039 Hypothyroidism, unspecified: Secondary | ICD-10-CM

## 2018-03-14 DIAGNOSIS — E038 Other specified hypothyroidism: Secondary | ICD-10-CM

## 2018-03-14 NOTE — Progress Notes (Signed)
   PRENATAL VISIT NOTE  Subjective:  Leah Howard is a 23 y.o. G1P0000 at [redacted]w[redacted]d being seen today for ongoing prenatal care.  She is currently monitored for the following issues for this low-risk pregnancy and has Supervision of normal first pregnancy, antepartum and Subclinical hypothyroidism on their problem list.  Patient reports bilateral nipple drainage noted. .  Contractions: Not present. Vag. Bleeding: None.  Movement: Present. Denies leaking of fluid.   The following portions of the patient's history were reviewed and updated as appropriate: allergies, current medications, past family history, past medical history, past social history, past surgical history and problem list. Problem list updated.  Objective:   Vitals:   03/14/18 0814  BP: 137/73  Pulse: 89  Weight: 197 lb (89.4 kg)    Fetal Status: Fetal Heart Rate (bpm): 145   Movement: Present     General:  Alert, oriented and cooperative. Patient is in no acute distress.  Skin: Skin is warm and dry. No rash noted.   Cardiovascular: Normal heart rate noted  Respiratory: Normal respiratory effort, no problems with respiration noted  Abdomen: Soft, gravid, appropriate for gestational age.  Pain/Pressure: Absent     Pelvic: Cervical exam deferred        Extremities: Normal range of motion.  Edema: None  Mental Status: Normal mood and affect. Normal behavior. Normal judgment and thought content.   Assessment and Plan:  Pregnancy: G1P0000 at [redacted]w[redacted]d  1. Supervision of normal first pregnancy, antepartum FHR; FH WNL  Need to call pt for NIPT   2. Subclinical hypothyroidism Pt on synthroid currently Results WNL on 02/14/2018 Repeat at 28 weeks  Preterm labor symptoms and general obstetric precautions including but not limited to vaginal bleeding, contractions, leaking of fluid and fetal movement were reviewed in detail with the patient. Please refer to After Visit Summary for other counseling recommendations.  Return in  about 4 weeks (around 04/11/2018).  Future Appointments  Date Time Provider Department Center  03/25/2018  8:00 AM [facility] [place] 3 [facility]  05/20/2018  8:00 AM Wendling, Jilda Roche, DO [facility]    Willodean Rosenthal, MD

## 2018-03-25 ENCOUNTER — Ambulatory Visit (HOSPITAL_COMMUNITY)
Admission: RE | Admit: 2018-03-25 | Discharge: 2018-03-25 | Disposition: A | Discharge status: Home / Self Care | Payer: No Typology Code available for payment source | Source: Ambulatory Visit | Attending: Obstetrics and Gynecology | Admitting: Obstetrics and Gynecology

## 2018-03-25 ENCOUNTER — Encounter (HOSPITAL_COMMUNITY): Payer: Self-pay

## 2018-03-25 ENCOUNTER — Other Ambulatory Visit (HOSPITAL_COMMUNITY): Payer: Self-pay | Admitting: *Deleted

## 2018-03-25 ENCOUNTER — Other Ambulatory Visit: Payer: Self-pay

## 2018-03-25 ENCOUNTER — Ambulatory Visit (HOSPITAL_COMMUNITY): Payer: No Typology Code available for payment source | Admitting: *Deleted

## 2018-03-25 VITALS — BP 128/76 | HR 86 | Wt 201.8 lb

## 2018-03-25 DIAGNOSIS — O99212 Obesity complicating pregnancy, second trimester: Secondary | ICD-10-CM

## 2018-03-25 DIAGNOSIS — Z3A23 23 weeks gestation of pregnancy: Secondary | ICD-10-CM

## 2018-03-25 DIAGNOSIS — O2692 Pregnancy related conditions, unspecified, second trimester: Secondary | ICD-10-CM | POA: Diagnosis not present

## 2018-03-25 DIAGNOSIS — E039 Hypothyroidism, unspecified: Secondary | ICD-10-CM | POA: Diagnosis not present

## 2018-03-25 DIAGNOSIS — Z362 Encounter for other antenatal screening follow-up: Secondary | ICD-10-CM | POA: Diagnosis not present

## 2018-03-25 DIAGNOSIS — O99282 Endocrine, nutritional and metabolic diseases complicating pregnancy, second trimester: Secondary | ICD-10-CM | POA: Insufficient documentation

## 2018-03-28 ENCOUNTER — Ambulatory Visit: Payer: No Typology Code available for payment source | Admitting: Family Medicine

## 2018-03-31 MED FILL — LEVOTHYROXINE 25 MCG TABLET: 25 | 30 days supply | Qty: 30 | Fill #1

## 2018-04-08 ENCOUNTER — Ambulatory Visit (HOSPITAL_COMMUNITY): Payer: No Typology Code available for payment source

## 2018-04-11 ENCOUNTER — Ambulatory Visit (INDEPENDENT_AMBULATORY_CARE_PROVIDER_SITE_OTHER): Payer: No Typology Code available for payment source | Admitting: Obstetrics & Gynecology

## 2018-04-11 ENCOUNTER — Encounter: Payer: Self-pay | Admitting: Obstetrics & Gynecology

## 2018-04-11 ENCOUNTER — Other Ambulatory Visit: Payer: Self-pay

## 2018-04-11 VITALS — BP 127/76 | HR 107 | Temp 98.0°F | Wt 204.1 lb

## 2018-04-11 DIAGNOSIS — E038 Other specified hypothyroidism: Secondary | ICD-10-CM

## 2018-04-11 DIAGNOSIS — Z3A25 25 weeks gestation of pregnancy: Secondary | ICD-10-CM

## 2018-04-11 DIAGNOSIS — Z3400 Encounter for supervision of normal first pregnancy, unspecified trimester: Secondary | ICD-10-CM

## 2018-04-11 DIAGNOSIS — Z3402 Encounter for supervision of normal first pregnancy, second trimester: Secondary | ICD-10-CM

## 2018-04-11 DIAGNOSIS — E039 Hypothyroidism, unspecified: Secondary | ICD-10-CM

## 2018-04-11 NOTE — Patient Instructions (Signed)
Return to office for any scheduled appointments. Call the office or go to the MAU at [facility] [place] if:  You begin to have strong, frequent contractions  Your water breaks.  Sometimes it is a big gush of fluid, sometimes it is just a trickle that keeps getting your panties wet or running down your legs  You have vaginal bleeding.  It is normal to have a small amount of spotting if your cervix was checked.   You do not feel your baby moving like normal.  If you do not, get something to eat and drink and lay down and focus on feeling your baby move.   If your baby is still not moving like normal, you should call the office or go to MAU.  Any other obstetric concerns.   

## 2018-04-11 NOTE — Progress Notes (Signed)
   PRENATAL VISIT NOTE  Subjective:  Leah Howard is a 23 y.o. G1P0000 at [redacted]w[redacted]d being seen today for ongoing prenatal care.  She is currently monitored for the following issues for this low-risk pregnancy and has Supervision of normal first pregnancy, antepartum and Subclinical hypothyroidism on their problem list.  Patient reports no complaints.  Contractions: Not present. Vag. Bleeding: None.  Movement: Present. Denies leaking of fluid.   The following portions of the patient's history were reviewed and updated as appropriate: allergies, current medications, past family history, past medical history, past social history, past surgical history and problem list.   Objective:   Vitals:   04/11/18 0848  BP: 127/76  Pulse: (!) 107  Temp: 98 F (36.7 C)  Weight: 204 lb 1.3 oz (92.6 kg)    Fetal Status: Fetal Heart Rate (bpm): 142 Fundal Height: 26 cm Movement: Present     General:  Alert, oriented and cooperative. Patient is in no acute distress.  Skin: Skin is warm and dry. No rash noted.   Cardiovascular: Normal heart rate noted  Respiratory: Normal respiratory effort, no problems with respiration noted  Abdomen: Soft, gravid, appropriate for gestational age.  Pain/Pressure: Absent     Pelvic: Cervical exam deferred        Extremities: Normal range of motion.  Edema: None  Mental Status: Normal mood and affect. Normal behavior. Normal judgment and thought content.   Assessment and Plan:  Pregnancy: G1P0000 at [redacted]w[redacted]d 1. Subclinical hypothyroidism Continue Synthroid, check labs next visit.  2. Supervision of normal first pregnancy, antepartum Third trimester labs, Tdap next visit. Preterm labor symptoms and general obstetric precautions including but not limited to vaginal bleeding, contractions, leaking of fluid and fetal movement were reviewed in detail with the patient. Please refer to After Visit Summary for other counseling recommendations.   Return in about 3 weeks  (around 05/02/2018) for 2 hr GTT, 3rd trimester labs, Thyroid panel, TDap, OB Visit**OFFICE**.  Future Appointments  Date Time Provider Department Center  04/29/2018  8:30 AM Levie Heritage, DO [facility] None  05/06/2018  7:40 AM [facility] NURSE [facility]  05/06/2018  8:00 AM [facility] [place] 3 [facility]  05/20/2018  8:00 AM Wendling, Jilda Roche, DO [facility]    Jaynie Collins, MD

## 2018-04-29 ENCOUNTER — Encounter: Payer: No Typology Code available for payment source | Admitting: Family Medicine

## 2018-05-05 ENCOUNTER — Other Ambulatory Visit: Payer: Self-pay

## 2018-05-05 ENCOUNTER — Ambulatory Visit (INDEPENDENT_AMBULATORY_CARE_PROVIDER_SITE_OTHER): Payer: No Typology Code available for payment source | Admitting: Family Medicine

## 2018-05-05 ENCOUNTER — Other Ambulatory Visit: Payer: Self-pay | Admitting: Family Medicine

## 2018-05-05 VITALS — BP 124/78 | HR 88 | Temp 98.6°F | Wt 208.0 lb

## 2018-05-05 DIAGNOSIS — Z3403 Encounter for supervision of normal first pregnancy, third trimester: Secondary | ICD-10-CM

## 2018-05-05 DIAGNOSIS — Z3A29 29 weeks gestation of pregnancy: Secondary | ICD-10-CM

## 2018-05-05 DIAGNOSIS — E039 Hypothyroidism, unspecified: Secondary | ICD-10-CM

## 2018-05-05 DIAGNOSIS — E038 Other specified hypothyroidism: Secondary | ICD-10-CM

## 2018-05-05 DIAGNOSIS — Z3400 Encounter for supervision of normal first pregnancy, unspecified trimester: Secondary | ICD-10-CM

## 2018-05-05 DIAGNOSIS — Z23 Encounter for immunization: Secondary | ICD-10-CM

## 2018-05-05 MED ORDER — LEVOTHYROXINE SODIUM 75 MCG PO TABS: 75.0000 ug | ORAL_TABLET | Freq: Every day | ORAL | 0 refills | Status: DC

## 2018-05-05 NOTE — Telephone Encounter (Signed)
TSH drawn today by obgyn. Appointment with PCP 05/20/18. Requested Prescriptions  Pending Prescriptions Disp Refills  . levothyroxine (SYNTHROID) 75 MCG tablet 90 tablet 0    Sig: Take 1 tablet (75 mcg total) by mouth daily before breakfast.     Endocrinology:  Hypothyroid Agents Failed - 05/05/2018  9:44 AM      Failed - TSH needs to be rechecked within 3 months after an abnormal result. Refill until TSH is due.      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

## 2018-05-05 NOTE — Progress Notes (Signed)
   PRENATAL VISIT NOTE  Subjective:  Leah Howard is a 23 y.o. G1P0000 at [redacted]w[redacted]d being seen today for ongoing prenatal care.  She is currently monitored for the following issues for this high-risk pregnancy and has Supervision of normal first pregnancy, antepartum and Subclinical hypothyroidism on their problem list.  Patient reports backache.  Contractions: Not present. Vag. Bleeding: None.  Movement: Present. Denies leaking of fluid.   The following portions of the patient's history were reviewed and updated as appropriate: allergies, current medications, past family history, past medical history, past social history, past surgical history and problem list.   Objective:   Vitals:   05/05/18 0833  BP: 124/78  Pulse: 88  Temp: 98.6 F (37 C)  Weight: 208 lb (94.3 kg)    Fetal Status: Fetal Heart Rate (bpm): 155   Movement: Present     General:  Alert, oriented and cooperative. Patient is in no acute distress.  Skin: Skin is warm and dry. No rash noted.   Cardiovascular: Normal heart rate noted  Respiratory: Normal respiratory effort, no problems with respiration noted  Abdomen: Soft, gravid, appropriate for gestational age.  Pain/Pressure: Absent     Pelvic: Cervical exam deferred        Extremities: Normal range of motion.  Edema: None  Mental Status: Normal mood and affect. Normal behavior. Normal judgment and thought content.   Assessment and Plan:  Pregnancy: G1P0000 at [redacted]w[redacted]d 1. Supervision of normal first pregnancy, antepartum FHT and FH normal. Stretching recommended for back pain - CBC - Glucose Tolerance, 2 Hours w/1 Hour - HIV Antibody (routine testing w rflx) - RPR - Tdap vaccine greater than or equal to 7yo IM  2. Subclinical hypothyroidism Check TSH today. - TSH  Preterm labor symptoms and general obstetric precautions including but not limited to vaginal bleeding, contractions, leaking of fluid and fetal movement were reviewed in detail with the patient.  Please refer to After Visit Summary for other counseling recommendations.   Return in about 4 weeks (around 06/02/2018) for OB f/u, virtual.  Future Appointments  Date Time Provider Department Center  05/06/2018  8:00 AM [facility] NURSE [facility]  05/06/2018  8:00 AM [facility] [place] 3 [facility]  05/20/2018  8:00 AM Wendling, Jilda Roche, DO [facility]    Levie Heritage, DO

## 2018-05-06 ENCOUNTER — Ambulatory Visit (HOSPITAL_COMMUNITY): Payer: No Typology Code available for payment source | Admitting: *Deleted

## 2018-05-06 ENCOUNTER — Ambulatory Visit (HOSPITAL_COMMUNITY)
Admission: RE | Admit: 2018-05-06 | Discharge: 2018-05-06 | Disposition: A | Discharge status: Home / Self Care | Payer: No Typology Code available for payment source | Source: Ambulatory Visit | Attending: Obstetrics and Gynecology | Admitting: Obstetrics and Gynecology

## 2018-05-06 ENCOUNTER — Other Ambulatory Visit: Payer: Self-pay | Admitting: Family Medicine

## 2018-05-06 ENCOUNTER — Encounter (HOSPITAL_COMMUNITY): Payer: Self-pay

## 2018-05-06 ENCOUNTER — Other Ambulatory Visit (HOSPITAL_COMMUNITY): Payer: Self-pay | Admitting: *Deleted

## 2018-05-06 VITALS — Temp 97.8°F

## 2018-05-06 DIAGNOSIS — O2693 Pregnancy related conditions, unspecified, third trimester: Secondary | ICD-10-CM | POA: Diagnosis not present

## 2018-05-06 DIAGNOSIS — Z362 Encounter for other antenatal screening follow-up: Secondary | ICD-10-CM | POA: Insufficient documentation

## 2018-05-06 DIAGNOSIS — O99283 Endocrine, nutritional and metabolic diseases complicating pregnancy, third trimester: Secondary | ICD-10-CM | POA: Diagnosis not present

## 2018-05-06 DIAGNOSIS — E039 Hypothyroidism, unspecified: Secondary | ICD-10-CM | POA: Diagnosis not present

## 2018-05-06 DIAGNOSIS — Z3A29 29 weeks gestation of pregnancy: Secondary | ICD-10-CM

## 2018-05-06 DIAGNOSIS — O99213 Obesity complicating pregnancy, third trimester: Secondary | ICD-10-CM | POA: Diagnosis not present

## 2018-05-06 LAB — CBC
Hematocrit: 35.8 % (ref 34.0–46.6)
Hemoglobin: 11.9 g/dL (ref 11.1–15.9)
MCH: 28.3 pg (ref 26.6–33.0)
MCHC: 33.2 g/dL (ref 31.5–35.7)
MCV: 85 fL (ref 79–97)
Platelets: 193 x10E3/uL (ref 150–450)
RBC: 4.21 x10E6/uL (ref 3.77–5.28)
RDW: 12.6 % (ref 11.7–15.4)
WBC: 9.9 x10E3/uL (ref 3.4–10.8)

## 2018-05-06 LAB — GLUCOSE TOLERANCE, 2 HOURS W/ 1HR
Glucose, 1 hour: 122 mg/dL (ref 65–179)
Glucose, 2 hour: 77 mg/dL (ref 65–152)
Glucose, Fasting: 73 mg/dL (ref 65–91)

## 2018-05-06 LAB — HIV ANTIBODY (ROUTINE TESTING W REFLEX): HIV Screen 4th Generation wRfx: NONREACTIVE

## 2018-05-06 LAB — TSH: TSH: 4.4 u[IU]/mL (ref 0.450–4.500)

## 2018-05-06 LAB — RPR: RPR Ser Ql: NONREACTIVE

## 2018-05-06 MED ORDER — LEVOTHYROXINE SODIUM 112 MCG PO TABS: 112.0000 ug | ORAL_TABLET | Freq: Every day | ORAL | 3 refills | Status: AC

## 2018-05-06 MED FILL — LEVOTHYROXINE 112 MCG TAB: 112 | 30 days supply | Qty: 30 | Fill #0

## 2018-05-20 ENCOUNTER — Encounter: Payer: No Typology Code available for payment source | Admitting: Family Medicine

## 2018-05-20 ENCOUNTER — Other Ambulatory Visit: Payer: Self-pay

## 2018-06-03 ENCOUNTER — Ambulatory Visit (HOSPITAL_COMMUNITY)
Admission: RE | Admit: 2018-06-03 | Discharge: 2018-06-03 | Disposition: A | Discharge status: Home / Self Care | Payer: No Typology Code available for payment source | Source: Ambulatory Visit | Attending: Obstetrics and Gynecology | Admitting: Obstetrics and Gynecology

## 2018-06-03 ENCOUNTER — Ambulatory Visit (HOSPITAL_COMMUNITY): Payer: No Typology Code available for payment source | Admitting: *Deleted

## 2018-06-03 ENCOUNTER — Encounter (HOSPITAL_COMMUNITY): Payer: Self-pay | Admitting: *Deleted

## 2018-06-03 ENCOUNTER — Ambulatory Visit (INDEPENDENT_AMBULATORY_CARE_PROVIDER_SITE_OTHER): Payer: No Typology Code available for payment source | Admitting: Family Medicine

## 2018-06-03 ENCOUNTER — Other Ambulatory Visit: Payer: Self-pay

## 2018-06-03 ENCOUNTER — Other Ambulatory Visit (HOSPITAL_COMMUNITY): Payer: Self-pay | Admitting: *Deleted

## 2018-06-03 VITALS — BP 122/71 | HR 91

## 2018-06-03 DIAGNOSIS — Z3403 Encounter for supervision of normal first pregnancy, third trimester: Secondary | ICD-10-CM

## 2018-06-03 DIAGNOSIS — O99283 Endocrine, nutritional and metabolic diseases complicating pregnancy, third trimester: Secondary | ICD-10-CM | POA: Diagnosis not present

## 2018-06-03 DIAGNOSIS — Z3400 Encounter for supervision of normal first pregnancy, unspecified trimester: Secondary | ICD-10-CM | POA: Diagnosis present

## 2018-06-03 DIAGNOSIS — O99213 Obesity complicating pregnancy, third trimester: Secondary | ICD-10-CM

## 2018-06-03 DIAGNOSIS — Z3A33 33 weeks gestation of pregnancy: Secondary | ICD-10-CM

## 2018-06-03 DIAGNOSIS — E039 Hypothyroidism, unspecified: Secondary | ICD-10-CM | POA: Diagnosis not present

## 2018-06-03 DIAGNOSIS — Z362 Encounter for other antenatal screening follow-up: Secondary | ICD-10-CM | POA: Diagnosis not present

## 2018-06-03 DIAGNOSIS — O2693 Pregnancy related conditions, unspecified, third trimester: Secondary | ICD-10-CM

## 2018-06-03 DIAGNOSIS — O99280 Endocrine, nutritional and metabolic diseases complicating pregnancy, unspecified trimester: Secondary | ICD-10-CM

## 2018-06-03 DIAGNOSIS — E038 Other specified hypothyroidism: Secondary | ICD-10-CM

## 2018-06-03 NOTE — Progress Notes (Signed)
TELEHEALTH OBSTETRICS PRENATAL VIRTUAL VIDEO VISIT ENCOUNTER NOTE  Provider location: [facility] [employer] at [facility]   I connected with Leah Howard on 06/03/18 at  9:00 AM EDT by WebEx Video Encounter at home and verified that I am speaking with the correct person using two identifiers.   I discussed the limitations, risks, security and privacy concerns of performing an evaluation and management service by telephone and the availability of in person appointments. I also discussed with the patient that there may be a patient responsible charge related to this service. The patient expressed understanding and agreed to proceed. Subjective:  Leah Howard is a 23 y.o. G1P0000 at [redacted]w[redacted]d being seen today for ongoing prenatal care.  She is currently monitored for the following issues for this high-risk pregnancy and has Supervision of normal first pregnancy, antepartum and Subclinical hypothyroidism on their problem list.  Patient reports occasional contractions.  Contractions: Not present. Vag. Bleeding: None.  Movement: Present. Denies any leaking of fluid.   The following portions of the patient's history were reviewed and updated as appropriate: allergies, current medications, past family history, past medical history, past social history, past surgical history and problem list.   Objective:   Vitals:   06/03/18 0905  BP: 122/71  Pulse: 91    Fetal Status:     Movement: Present     General:  Alert, oriented and cooperative. Patient is in no acute distress.  Respiratory: Normal respiratory effort, no problems with respiration noted  Mental Status: Normal mood and affect. Normal behavior. Normal judgment and thought content.  Rest of physical exam deferred due to type of encounter  Imaging: [place] Mfm Ob Limited  Result Date: 05/06/2018 ----------------------------------------------------------------------  OBSTETRICS REPORT                       (Signed Final  05/06/2018 09:46 am) ---------------------------------------------------------------------- Patient Info  ID #:       [id number]                          D.O.B.:  1995-11-18 (22 yrs)  Name:       Leah Howard                 Visit Date: 05/06/2018 08:44 am ---------------------------------------------------------------------- Performed By  Performed By:     Emeline Darling BS,      Ref. Address:     [address]                    [address]                                                             [address]  Attending:        Noralee Space MD        [facility]  Referred By:      [facility] [place] ---------------------------------------------------------------------- Orders   #  Description                          Code         Ordered By   1  [place] MFM OB LIMITED                    16109.60     Lin Landsman  ----------------------------------------------------------------------   #  Order #                    Accession #                 Episode #   1  [id number]                  [id number]                  [id number]  ---------------------------------------------------------------------- Indications   Hypothyroid                                    O99.280 E03.9   Medical complication of pregnancy (PCOS)       O26.90   Obesity complicating pregnancy, second         O99.212   trimester(BMI 35)(No Genetic testing)   [redacted] weeks gestation of pregnancy                Z3A.29   Encounter for other antenatal screening        Z36.2   follow-up  ---------------------------------------------------------------------- Vital Signs  Weight (lb): 208                               Height:        5'4"  BMI:         35.7 ---------------------------------------------------------------------- Fetal Evaluation  Num Of Fetuses:         1  Fetal Heart Rate(bpm):  148  Cardiac Activity:       Observed   Presentation:           Breech  Placenta:               Posterior  P. Cord Insertion:      Previously Visualized  Amniotic Fluid  AFI FV:      Subjectively low-normal  AFI Sum(cm)     %Tile       Largest Pocket(cm)  9.48            8           3.22  RUQ(cm)       RLQ(cm)       LUQ(cm)        LLQ(cm)  3.02          2.07          3.22           1.17 ---------------------------------------------------------------------- Biometry  BPD:  72.8  mm     G. Age:  29w 2d         40  %    CI:        71.97   %    70 - 86                                                          FL/HC:      19.3   %    19.6 - 20.8  HC:      273.1  mm     G. Age:  29w 6d         36  %    HC/AC:      0.98        0.99 - 1.21  AC:      279.2  mm     G. Age:  32w 0d       > 97  %    FL/BPD:     72.4   %    71 - 87  FL:       52.7  mm     G. Age:  28w 0d         11  %    FL/AC:      18.9   %    20 - 24  Est. FW:    1553  gm      3 lb 7 oz     75  % ---------------------------------------------------------------------- OB History  Gravidity:    1         Term:   0        Prem:   0        SAB:   0  TOP:          0       Ectopic:  0        Living: 0 ---------------------------------------------------------------------- Gestational Age  LMP:           29w 1d        Date:  10/14/17                 EDD:   07/21/18  U/S Today:     29w 6d                                        EDD:   07/16/18  Best:          29w 1d     Det. By:  LMP  (10/14/17)          EDD:   07/21/18 ---------------------------------------------------------------------- Anatomy  Cranium:               Appears normal         Aortic Arch:            Appears normal  Cavum:                 Previously seen        Ductal Arch:            Previously seen  Ventricles:            Appears normal  Diaphragm:              Previously seen  Choroid Plexus:        Previously seen        Stomach:                Appears normal, left                                                                         sided  Cerebellum:            Previously seen        Abdomen:                Previously seen  Posterior Fossa:       Previously seen        Abdominal Wall:         Previously seen  Nuchal Fold:           Previously seen        Cord Vessels:           Previously seen  Face:                  Orbits and profile     Kidneys:                Appear normal                         previously seen  Lips:                  Appears normal         Bladder:                Appears normal  Thoracic:              Appears normal         Spine:                  Previously seen  Heart:                 Previously seen        Upper Extremities:      Previously seen  RVOT:                  Not well visualized    Lower Extremities:      Previously seen  LVOT:                  Appears normal  Other:  Fetus appears to be female. Heels visualized previously. ---------------------------------------------------------------------- Cervix Uterus Adnexa  Cervix  Not visualized (advanced GA >24wks) ---------------------------------------------------------------------- Impression  Patient returned for completion of fetal anatomy.  Amniotic fluid is normal and good fetal activity is seen. Fetal  growth is appropriate for gestational age. Cardiac anatomy  still could not be evaluated because of fetal position.  We reassured the patient of normal fetal growth. ---------------------------------------------------------------------- Recommendations  Fetal growth assessment in 4 to 5 weeks (hypothyroidism  and maternal obesity). ----------------------------------------------------------------------                  Noralee Space, MD Electronically Signed Final Report  05/06/2018 09:46 am ----------------------------------------------------------------------   Assessment and Plan:  Pregnancy: G1P0000 at 784w1d  1. Supervision of normal first pregnancy, antepartum Good fetal activity.   2. Subclinical hypothyroidism No problems with medication.  Has US for growth today. Recheck TSH at next appt.   Preterm labor symptoms and general obstetric precautions including but not limited to vaginal bleeding, contractions, leaking of fluid and fetal movement were reviewed in detail with the patient. I discussed the assessment and treatment plan with the patient. The patient was provided an opportunity to ask questions and all were answered. The patient agreed with the plan and demonstrated an understanding of the instructions. The patient was advised to call back or seek an in-person office evaluation/go to [facility] at [facility] for any urgent or concerning symptoms. Please refer to After Visit Summary for other counseling recommendations.   I provided 12 minutes of face-to-face time during this encounter.  No follow-ups on file.  Future Appointments  Date Time Provider Department Center  06/03/2018  1:30 PM WH-MFC NURSE WH-MFC MFC-US  06/03/2018  1:30 PM WH-MFC US 1 WH-MFCUS MFC-US  06/23/2018  9:30 AM Willodean RosenthalHarraway-Smith, Carolyn, MD CWH-WMHP None    Levie HeritageJacob J Aniela Caniglia, DO [facility]

## 2018-06-23 ENCOUNTER — Encounter: Payer: No Typology Code available for payment source | Admitting: Obstetrics & Gynecology

## 2018-06-23 MED FILL — LEVOTHYROXINE 75 MCG TABLET: 75 | 90 days supply | Qty: 90 | Fill #0

## 2018-06-24 ENCOUNTER — Ambulatory Visit (INDEPENDENT_AMBULATORY_CARE_PROVIDER_SITE_OTHER): Payer: No Typology Code available for payment source | Admitting: Obstetrics and Gynecology

## 2018-06-24 ENCOUNTER — Other Ambulatory Visit: Payer: Self-pay

## 2018-06-24 VITALS — BP 149/86 | HR 95

## 2018-06-24 DIAGNOSIS — Z3A36 36 weeks gestation of pregnancy: Secondary | ICD-10-CM | POA: Diagnosis not present

## 2018-06-24 DIAGNOSIS — O321XX0 Maternal care for breech presentation, not applicable or unspecified: Secondary | ICD-10-CM

## 2018-06-24 DIAGNOSIS — Z3400 Encounter for supervision of normal first pregnancy, unspecified trimester: Secondary | ICD-10-CM

## 2018-06-24 DIAGNOSIS — E038 Other specified hypothyroidism: Secondary | ICD-10-CM

## 2018-06-24 DIAGNOSIS — E039 Hypothyroidism, unspecified: Secondary | ICD-10-CM

## 2018-06-24 DIAGNOSIS — R030 Elevated blood-pressure reading, without diagnosis of hypertension: Secondary | ICD-10-CM

## 2018-06-24 NOTE — Progress Notes (Signed)
   PRENATAL VISIT NOTE  Subjective:  Leah Howard is a 23 y.o. G1P0000 at 40w1dbeing seen today for ongoing prenatal care.  She is currently monitored for the following issues for this low-risk pregnancy and has Supervision of normal first pregnancy, antepartum and Subclinical hypothyroidism on their problem list.  Patient reports Braxton-Hicks.  Contractions: Irritability. Vag. Bleeding: None.  Movement: Present. Denies leaking of fluid.   The following portions of the patient's history were reviewed and updated as appropriate: allergies, current medications, past family history, past medical history, past social history, past surgical history and problem list.   Objective:   Vitals:   06/24/18 1035  BP: (!) 149/86  Pulse: 95    Fetal Status: Fetal Heart Rate (bpm): 150   Movement: Present     General:  Alert, oriented and cooperative. Patient is in no acute distress.  Skin: Skin is warm and dry. No rash noted.   Cardiovascular: Normal heart rate noted  Respiratory: Normal respiratory effort, no problems with respiration noted  Abdomen: Soft, gravid, appropriate for gestational age.  Pain/Pressure: Present     Pelvic: Cervical exam deferred        Extremities: Normal range of motion.  Edema: None  Mental Status: Normal mood and affect. Normal behavior. Normal judgment and thought content.   Pt informed that the ultrasound is considered a limited OB ultrasound and is not intended to be a complete ultrasound exam.  Patient also informed that the ultrasound is not being completed with the intent of assessing for fetal or placental anomalies or any pelvic abnormalities.  Explained that the purpose of today's ultrasound is to assess for  presentation.  Patient acknowledges the purpose of the exam and the limitations of the study.    Assessment and Plan:  Pregnancy: G1P0000 at 330w1d1. Supervision of normal first pregnancy, antepartum - Culture, beta strep (group b only) -  [facility]lam[facility]. Subclinical hypothyroidism Recheck TSH/T3/T4  3. Elevated blood pressure reading in office without diagnosis of hypertension - Mildly elevated in office today -pt has log of home BP and all are within normal range - will get lab work today - if persistently elevated, will need early IOL - Protein / creatinine ratio, urine - CBC - Comp Met (CMET)  4. Engagement of fetus in breech position, single or unspecified fetus Breech today, confirmed by [place] reviewed ECV, risks/benefits, patient desires to try - will schedule for 37 weeks    Preterm labor symptoms and general obstetric precautions including but not limited to vaginal bleeding, contractions, leaking of fluid and fetal movement were reviewed in detail with the patient. Please refer to After Visit Summary for other counseling recommendations.   Return in about 1 week (around 07/01/2018) for OB visit, virtual.  Future Appointments  Date Time Provider [place]6/19/2020  9:00 AM [place]  07/01/2018  9:00 AM WH-MFC [place] WH-MFCUS MFC-US  07/01/2018 10:45 AM StTruett MainlandDO CWH-WMHP None    KeSloan LeiterMD

## 2018-06-25 LAB — T4: T4, Total: 7.5 ug/dL (ref 4.5–12.0)

## 2018-06-25 LAB — COMPREHENSIVE METABOLIC PANEL WITH GFR
ALT: 13 IU/L (ref 0–32)
AST: 23 IU/L (ref 0–40)
Albumin/Globulin Ratio: 1.6 (ref 1.2–2.2)
Albumin: 3.8 g/dL — ABNORMAL LOW (ref 3.9–5.0)
Alkaline Phosphatase: 132 IU/L — ABNORMAL HIGH (ref 39–117)
BUN/Creatinine Ratio: 10 (ref 9–23)
BUN: 5 mg/dL — ABNORMAL LOW (ref 6–20)
Bilirubin Total: <0.2 mg/dL (ref 0.0–1.2)
CO2: 19 mmol/L — ABNORMAL LOW (ref 20–29)
Calcium: 8.8 mg/dL (ref 8.7–10.2)
Chloride: 107 mmol/L — ABNORMAL HIGH (ref 96–106)
Creatinine, Ser: 0.51 mg/dL — ABNORMAL LOW (ref 0.57–1.00)
GFR calc Af Amer: 157 mL/min/1.73 (ref >59)
GFR calc non Af Amer: 136 mL/min/1.73 (ref >59)
Globulin, Total: 2.4 g/dL (ref 1.5–4.5)
Glucose: 93 mg/dL (ref 65–99)
Potassium: 4.3 mmol/L (ref 3.5–5.2)
Sodium: 138 mmol/L (ref 134–144)
Total Protein: 6.2 g/dL (ref 6.0–8.5)

## 2018-06-25 LAB — CBC
Hematocrit: 38.6 % (ref 34.0–46.6)
Hemoglobin: 12 g/dL (ref 11.1–15.9)
MCH: 26.1 pg — ABNORMAL LOW (ref 26.6–33.0)
MCHC: 31.1 g/dL — ABNORMAL LOW (ref 31.5–35.7)
MCV: 84 fL (ref 79–97)
Platelets: 244 x10E3/uL (ref 150–450)
RBC: 4.59 x10E6/uL (ref 3.77–5.28)
RDW: 14 % (ref 11.7–15.4)
WBC: 9.3 x10E3/uL (ref 3.4–10.8)

## 2018-06-25 LAB — PROTEIN / CREATININE RATIO, URINE
Creatinine, Urine: 131.1 mg/dL
Protein, Ur: 15.5 mg/dL
Protein/Creat Ratio: 118 mg/g{creat} (ref 0–200)

## 2018-06-25 LAB — TSH: TSH: 3.63 u[IU]/mL (ref 0.450–4.500)

## 2018-06-25 LAB — T3: T3, Total: 169 ng/dL (ref 71–180)

## 2018-06-27 ENCOUNTER — Telehealth: Payer: Self-pay | Admitting: Obstetrics and Gynecology

## 2018-06-27 ENCOUNTER — Encounter (HOSPITAL_COMMUNITY): Payer: Self-pay | Admitting: *Deleted

## 2018-06-27 ENCOUNTER — Telehealth: Payer: Self-pay

## 2018-06-27 ENCOUNTER — Encounter: Payer: Self-pay | Admitting: Obstetrics and Gynecology

## 2018-06-27 ENCOUNTER — Telehealth (HOSPITAL_COMMUNITY): Payer: Self-pay | Admitting: *Deleted

## 2018-06-27 DIAGNOSIS — O139 Gestational [pregnancy-induced] hypertension without significant proteinuria, unspecified trimester: Secondary | ICD-10-CM | POA: Insufficient documentation

## 2018-06-27 DIAGNOSIS — O329XX0 Maternal care for malpresentation of fetus, unspecified, not applicable or unspecified: Secondary | ICD-10-CM | POA: Insufficient documentation

## 2018-06-27 LAB — GC/CHLAMYDIA PROBE AMP (~~LOC~~) NOT AT ARMC
Chlamydia: NEGATIVE
Neisseria Gonorrhea: NEGATIVE

## 2018-06-27 LAB — CULTURE, BETA STREP (GROUP B ONLY): Strep Gp B Culture: POSITIVE — AB

## 2018-06-27 NOTE — Telephone Encounter (Signed)
Telephone call to patient, left her a message regarding her scheduled ECV. Scheduled for 06/30/18 at 8:30am at [facility].  Instructed patient to be NPO.

## 2018-06-27 NOTE — Telephone Encounter (Signed)
Preadmission screen  

## 2018-06-27 NOTE — Telephone Encounter (Signed)
Patient called and made aware that her version is scheduled for Thursday at [place] at the hospital. Patient made aware that she needs a blood pressure check on Wednesday June 17th at 130 pm  Kathrene Alu RN

## 2018-06-28 ENCOUNTER — Other Ambulatory Visit (HOSPITAL_COMMUNITY)
Admission: RE | Admit: 2018-06-28 | Discharge: 2018-06-28 | Disposition: A | Discharge status: Home / Self Care | Payer: No Typology Code available for payment source | Source: Ambulatory Visit | Attending: Obstetrics and Gynecology | Admitting: Obstetrics and Gynecology

## 2018-06-28 ENCOUNTER — Other Ambulatory Visit: Payer: Self-pay

## 2018-06-28 DIAGNOSIS — Z1159 Encounter for screening for other viral diseases: Secondary | ICD-10-CM | POA: Insufficient documentation

## 2018-06-28 NOTE — MAU Note (Signed)
Asymptomatic swab collected without problem. 

## 2018-06-29 ENCOUNTER — Other Ambulatory Visit: Payer: Self-pay

## 2018-06-29 ENCOUNTER — Ambulatory Visit (INDEPENDENT_AMBULATORY_CARE_PROVIDER_SITE_OTHER): Payer: No Typology Code available for payment source

## 2018-06-29 VITALS — BP 129/70

## 2018-06-29 DIAGNOSIS — Z3490 Encounter for supervision of normal pregnancy, unspecified, unspecified trimester: Secondary | ICD-10-CM

## 2018-06-29 LAB — NOVEL CORONAVIRUS, NAA (HOSP ORDER, SEND-OUT TO REF LAB; TAT 18-24 HRS): SARS-CoV-2, NAA: NOT DETECTED

## 2018-06-29 NOTE — Progress Notes (Signed)
Pt has been checking BP at home. Recent readings are: 6/12: 113/67 6/13: 116/72 6/15:112/73 6/17: 105/78 6/17: 116/82  BP today 141/79. BP retake 129/70. Pt denies headache or visual changes. Pt has no complaints today. BP readings reviewed by Dr.Harraway-Smith.

## 2018-06-30 ENCOUNTER — Encounter (HOSPITAL_COMMUNITY)
Admission: AD | Disposition: A | Discharge status: Home / Self Care | Payer: Self-pay | Source: Home / Self Care | Attending: Obstetrics and Gynecology

## 2018-06-30 ENCOUNTER — Inpatient Hospital Stay (HOSPITAL_COMMUNITY): Payer: No Typology Code available for payment source | Admitting: Certified Registered Nurse Anesthetist

## 2018-06-30 ENCOUNTER — Encounter (HOSPITAL_COMMUNITY): Payer: No Typology Code available for payment source

## 2018-06-30 ENCOUNTER — Observation Stay (HOSPITAL_COMMUNITY)
Admission: RE | Admit: 2018-06-30 | Discharge: 2018-06-30 | Disposition: A | Discharge status: Home / Self Care | Payer: No Typology Code available for payment source | Source: Ambulatory Visit | Attending: Obstetrics & Gynecology | Admitting: Obstetrics & Gynecology

## 2018-06-30 ENCOUNTER — Inpatient Hospital Stay (HOSPITAL_COMMUNITY)
Admission: AD | Admit: 2018-06-30 | Discharge: 2018-07-03 | DRG: 788 | Disposition: A | Discharge status: Home / Self Care | Payer: No Typology Code available for payment source | Attending: Obstetrics and Gynecology | Admitting: Obstetrics and Gynecology

## 2018-06-30 ENCOUNTER — Other Ambulatory Visit: Payer: Self-pay

## 2018-06-30 ENCOUNTER — Encounter (HOSPITAL_COMMUNITY): Payer: Self-pay | Admitting: Obstetrics and Gynecology

## 2018-06-30 DIAGNOSIS — Z3A37 37 weeks gestation of pregnancy: Secondary | ICD-10-CM

## 2018-06-30 DIAGNOSIS — O99820 Streptococcus B carrier state complicating pregnancy: Secondary | ICD-10-CM

## 2018-06-30 DIAGNOSIS — O329XX0 Maternal care for malpresentation of fetus, unspecified, not applicable or unspecified: Secondary | ICD-10-CM | POA: Diagnosis not present

## 2018-06-30 DIAGNOSIS — O99284 Endocrine, nutritional and metabolic diseases complicating childbirth: Secondary | ICD-10-CM | POA: Diagnosis present

## 2018-06-30 DIAGNOSIS — O99824 Streptococcus B carrier state complicating childbirth: Secondary | ICD-10-CM | POA: Diagnosis present

## 2018-06-30 DIAGNOSIS — O139 Gestational [pregnancy-induced] hypertension without significant proteinuria, unspecified trimester: Secondary | ICD-10-CM | POA: Clinically undetermined

## 2018-06-30 DIAGNOSIS — O134 Gestational [pregnancy-induced] hypertension without significant proteinuria, complicating childbirth: Secondary | ICD-10-CM | POA: Diagnosis present

## 2018-06-30 DIAGNOSIS — E282 Polycystic ovarian syndrome: Secondary | ICD-10-CM | POA: Diagnosis present

## 2018-06-30 DIAGNOSIS — E02 Subclinical iodine-deficiency hypothyroidism: Secondary | ICD-10-CM | POA: Diagnosis present

## 2018-06-30 DIAGNOSIS — O99214 Obesity complicating childbirth: Secondary | ICD-10-CM | POA: Diagnosis present

## 2018-06-30 DIAGNOSIS — O321XX0 Maternal care for breech presentation, not applicable or unspecified: Principal | ICD-10-CM | POA: Diagnosis present

## 2018-06-30 DIAGNOSIS — E038 Other specified hypothyroidism: Secondary | ICD-10-CM | POA: Diagnosis present

## 2018-06-30 DIAGNOSIS — E039 Hypothyroidism, unspecified: Secondary | ICD-10-CM | POA: Diagnosis present

## 2018-06-30 LAB — COMPREHENSIVE METABOLIC PANEL WITH GFR
ALT: 19 U/L (ref 0–44)
AST: 28 U/L (ref 15–41)
Albumin: 3 g/dL — ABNORMAL LOW (ref 3.5–5.0)
Alkaline Phosphatase: 116 U/L (ref 38–126)
Anion gap: 9 (ref 5–15)
BUN: 6 mg/dL (ref 6–20)
CO2: 19 mmol/L — ABNORMAL LOW (ref 22–32)
Calcium: 8.9 mg/dL (ref 8.9–10.3)
Chloride: 111 mmol/L (ref 98–111)
Creatinine, Ser: 0.61 mg/dL (ref 0.44–1.00)
GFR calc Af Amer: >60 mL/min (ref >60)
GFR calc non Af Amer: >60 mL/min (ref >60)
Glucose, Bld: 78 mg/dL (ref 70–99)
Potassium: 3.5 mmol/L (ref 3.5–5.1)
Sodium: 139 mmol/L (ref 135–145)
Total Bilirubin: 0.5 mg/dL (ref 0.3–1.2)
Total Protein: 6.1 g/dL — ABNORMAL LOW (ref 6.5–8.1)

## 2018-06-30 LAB — CBC
HCT: 36.1 % (ref 36.0–46.0)
Hemoglobin: 11.8 g/dL — ABNORMAL LOW (ref 12.0–15.0)
MCH: 26.5 pg (ref 26.0–34.0)
MCHC: 32.7 g/dL (ref 30.0–36.0)
MCV: 80.9 fL (ref 80.0–100.0)
Platelets: 230 K/uL (ref 150–400)
RBC: 4.46 MIL/uL (ref 3.87–5.11)
RDW: 13.9 % (ref 11.5–15.5)
WBC: 10 K/uL (ref 4.0–10.5)
nRBC: 0 % (ref 0.0–0.2)

## 2018-06-30 LAB — PROTEIN / CREATININE RATIO, URINE
Creatinine, Urine: 137.83 mg/dL
Protein Creatinine Ratio: 0.11 mg/mg{creat} (ref 0.00–0.15)
Total Protein, Urine: 15 mg/dL

## 2018-06-30 LAB — TYPE AND SCREEN
ABO/RH(D): B POS
Antibody Screen: NEGATIVE

## 2018-06-30 LAB — ABO/RH: ABO/RH(D): B POS

## 2018-06-30 SURGERY — Surgical Case
Anesthesia: Spinal | Wound class: Clean Contaminated

## 2018-06-30 MED ORDER — PHENYLEPHRINE HCL-NACL 20-0.9 MG/250ML-% IV SOLN
INTRAVENOUS | Status: DC | PRN
  Administered 2018-06-30: 60 ug/min via INTRAVENOUS

## 2018-06-30 MED ORDER — OXYCODONE HCL 5 MG PO TABS: 5.0000 mg | ORAL_TABLET | Freq: Once | ORAL | Status: DC | PRN

## 2018-06-30 MED ORDER — MORPHINE SULFATE (PF) 0.5 MG/ML IJ SOLN
INTRAMUSCULAR | Status: AC
  Filled 2018-06-30: qty 10

## 2018-06-30 MED ORDER — NALOXONE HCL 4 MG/10ML IJ SOLN
1.0000 ug/kg/h | INTRAVENOUS | Status: DC | PRN
  Filled 2018-06-30: qty 5

## 2018-06-30 MED ORDER — OXYCODONE HCL 5 MG PO TABS: 5.0000 mg | ORAL_TABLET | Freq: Four times a day (QID) | ORAL | Status: DC | PRN

## 2018-06-30 MED ORDER — OXYTOCIN 40 UNITS IN NORMAL SALINE INFUSION - SIMPLE MED: 2.5000 [IU]/h | INTRAVENOUS | Status: AC

## 2018-06-30 MED ORDER — BUPIVACAINE IN DEXTROSE 0.75-8.25 % IT SOLN
INTRATHECAL | Status: DC | PRN
  Administered 2018-06-30: 1.5 mL via INTRATHECAL

## 2018-06-30 MED ORDER — NALBUPHINE HCL 10 MG/ML IJ SOLN: 5.0000 mg | INTRAMUSCULAR | Status: DC | PRN

## 2018-06-30 MED ORDER — WITCH HAZEL-GLYCERIN EX PADS: 1.0000 "application " | MEDICATED_PAD | CUTANEOUS | Status: DC | PRN

## 2018-06-30 MED ORDER — TERBUTALINE SULFATE 1 MG/ML IJ SOLN
0.2500 mg | Freq: Once | INTRAMUSCULAR | Status: AC | PRN
  Administered 2018-06-30: 0.25 mg via SUBCUTANEOUS
  Filled 2018-06-30: qty 1

## 2018-06-30 MED ORDER — ACETAMINOPHEN 160 MG/5ML PO SOLN: 325.0000 mg | ORAL | Status: DC | PRN

## 2018-06-30 MED ORDER — DIPHENHYDRAMINE HCL 25 MG PO CAPS: 25.0000 mg | ORAL_CAPSULE | Freq: Four times a day (QID) | ORAL | Status: DC | PRN

## 2018-06-30 MED ORDER — CEFAZOLIN SODIUM-DEXTROSE 2-4 GM/100ML-% IV SOLN
2.0000 g | INTRAVENOUS | Status: AC
  Administered 2018-06-30: 2 g via INTRAVENOUS
  Filled 2018-06-30: qty 100

## 2018-06-30 MED ORDER — DEXAMETHASONE SODIUM PHOSPHATE 4 MG/ML IJ SOLN
INTRAMUSCULAR | Status: DC | PRN
  Administered 2018-06-30: 4 mg via INTRAVENOUS

## 2018-06-30 MED ORDER — MEPERIDINE HCL 25 MG/ML IJ SOLN: 6.2500 mg | INTRAMUSCULAR | Status: DC | PRN

## 2018-06-30 MED ORDER — SOD CITRATE-CITRIC ACID 500-334 MG/5ML PO SOLN
30.0000 mL | ORAL | Status: AC
  Administered 2018-06-30: 30 mL via ORAL
  Filled 2018-06-30: qty 30

## 2018-06-30 MED ORDER — PRENATAL MULTIVITAMIN CH
1.0000 | ORAL_TABLET | Freq: Every day | ORAL | Status: DC
  Administered 2018-07-01 – 2018-07-03 (×3): 1 via ORAL
  Filled 2018-06-30 (×3): qty 1

## 2018-06-30 MED ORDER — SODIUM CHLORIDE 0.9% FLUSH: 3.0000 mL | INTRAVENOUS | Status: DC | PRN

## 2018-06-30 MED ORDER — LACTATED RINGERS IV SOLN
INTRAVENOUS | Status: DC
  Administered 2018-06-30 – 2018-07-01 (×2): via INTRAVENOUS

## 2018-06-30 MED ORDER — IBUPROFEN 800 MG PO TABS
800.0000 mg | ORAL_TABLET | Freq: Three times a day (TID) | ORAL | Status: AC
  Administered 2018-06-30 – 2018-07-03 (×8): 800 mg via ORAL
  Filled 2018-06-30 (×8): qty 1

## 2018-06-30 MED ORDER — OXYCODONE HCL 5 MG/5ML PO SOLN: 5.0000 mg | Freq: Once | ORAL | Status: DC | PRN

## 2018-06-30 MED ORDER — FENTANYL CITRATE (PF) 100 MCG/2ML IJ SOLN
INTRAMUSCULAR | Status: DC | PRN
  Administered 2018-06-30: 15 ug via INTRATHECAL

## 2018-06-30 MED ORDER — POLYETHYLENE GLYCOL 3350 17 G PO PACK
17.0000 g | PACK | Freq: Every day | ORAL | Status: DC
  Administered 2018-07-01 – 2018-07-03 (×3): 17 g via ORAL
  Filled 2018-06-30 (×3): qty 1

## 2018-06-30 MED ORDER — ZOLPIDEM TARTRATE 5 MG PO TABS: 5.0000 mg | ORAL_TABLET | Freq: Every evening | ORAL | Status: DC | PRN

## 2018-06-30 MED ORDER — STERILE WATER FOR IRRIGATION IR SOLN
Status: DC | PRN
  Administered 2018-06-30: 1000 mL

## 2018-06-30 MED ORDER — NALBUPHINE HCL 10 MG/ML IJ SOLN: 5.0000 mg | Freq: Once | INTRAMUSCULAR | Status: DC | PRN

## 2018-06-30 MED ORDER — MENTHOL 3 MG MT LOZG: 1.0000 | LOZENGE | OROMUCOSAL | Status: DC | PRN

## 2018-06-30 MED ORDER — SODIUM CHLORIDE 0.9 % IV SOLN
INTRAVENOUS | Status: DC | PRN
  Administered 2018-06-30: 40 [IU] via INTRAVENOUS

## 2018-06-30 MED ORDER — OXYTOCIN 40 UNITS IN NORMAL SALINE INFUSION - SIMPLE MED
INTRAVENOUS | Status: AC
  Filled 2018-06-30: qty 1000

## 2018-06-30 MED ORDER — DIBUCAINE (PERIANAL) 1 % EX OINT: 1.0000 "application " | TOPICAL_OINTMENT | CUTANEOUS | Status: DC | PRN

## 2018-06-30 MED ORDER — KETOROLAC TROMETHAMINE 30 MG/ML IJ SOLN
30.0000 mg | Freq: Four times a day (QID) | INTRAMUSCULAR | Status: AC | PRN
  Administered 2018-06-30: 30 mg via INTRAMUSCULAR

## 2018-06-30 MED ORDER — DEXAMETHASONE SODIUM PHOSPHATE 4 MG/ML IJ SOLN
INTRAMUSCULAR | Status: AC
  Filled 2018-06-30: qty 1

## 2018-06-30 MED ORDER — KETOROLAC TROMETHAMINE 30 MG/ML IJ SOLN
30.0000 mg | Freq: Four times a day (QID) | INTRAMUSCULAR | Status: AC | PRN
  Administered 2018-06-30: 30 mg via INTRAVENOUS
  Filled 2018-06-30: qty 1

## 2018-06-30 MED ORDER — FENTANYL CITRATE (PF) 100 MCG/2ML IJ SOLN
INTRAMUSCULAR | Status: AC
  Filled 2018-06-30: qty 2

## 2018-06-30 MED ORDER — LACTATED RINGERS IV SOLN
INTRAVENOUS | Status: DC | PRN
  Administered 2018-06-30 (×4): via INTRAVENOUS

## 2018-06-30 MED ORDER — ENOXAPARIN SODIUM 40 MG/0.4ML ~~LOC~~ SOLN
40.0000 mg | SUBCUTANEOUS | Status: DC
  Administered 2018-07-01 – 2018-07-03 (×3): 40 mg via SUBCUTANEOUS
  Filled 2018-06-30 (×3): qty 0.4

## 2018-06-30 MED ORDER — TETANUS-DIPHTH-ACELL PERTUSSIS 5-2.5-18.5 LF-MCG/0.5 IM SUSP: 0.5000 mL | Freq: Once | INTRAMUSCULAR | Status: DC

## 2018-06-30 MED ORDER — SODIUM CHLORIDE 0.9 % IV SOLN
INTRAVENOUS | Status: DC | PRN
  Administered 2018-06-30: 12:00:00 via INTRAVENOUS

## 2018-06-30 MED ORDER — ACETAMINOPHEN 325 MG PO TABS
650.0000 mg | ORAL_TABLET | Freq: Four times a day (QID) | ORAL | Status: DC
  Administered 2018-06-30 – 2018-07-03 (×12): 650 mg via ORAL
  Filled 2018-06-30 (×12): qty 2

## 2018-06-30 MED ORDER — ONDANSETRON HCL 4 MG/2ML IJ SOLN: 4.0000 mg | Freq: Three times a day (TID) | INTRAMUSCULAR | Status: DC | PRN

## 2018-06-30 MED ORDER — NALOXONE HCL 0.4 MG/ML IJ SOLN: 0.4000 mg | INTRAMUSCULAR | Status: DC | PRN

## 2018-06-30 MED ORDER — LACTATED RINGERS IV SOLN: INTRAVENOUS | Status: DC

## 2018-06-30 MED ORDER — SCOPOLAMINE 1 MG/3DAYS TD PT72: 1.0000 | MEDICATED_PATCH | Freq: Once | TRANSDERMAL | Status: DC

## 2018-06-30 MED ORDER — DIPHENHYDRAMINE HCL 25 MG PO CAPS: 25.0000 mg | ORAL_CAPSULE | ORAL | Status: DC | PRN

## 2018-06-30 MED ORDER — SCOPOLAMINE 1 MG/3DAYS TD PT72
MEDICATED_PATCH | TRANSDERMAL | Status: AC
  Filled 2018-06-30: qty 1

## 2018-06-30 MED ORDER — ACETAMINOPHEN 325 MG PO TABS: 325.0000 mg | ORAL_TABLET | ORAL | Status: DC | PRN

## 2018-06-30 MED ORDER — SIMETHICONE 80 MG PO CHEW
80.0000 mg | CHEWABLE_TABLET | Freq: Three times a day (TID) | ORAL | Status: DC
  Administered 2018-06-30 – 2018-07-03 (×9): 80 mg via ORAL
  Filled 2018-06-30 (×9): qty 1

## 2018-06-30 MED ORDER — PHENYLEPHRINE HCL-NACL 20-0.9 MG/250ML-% IV SOLN
INTRAVENOUS | Status: AC
  Filled 2018-06-30: qty 250

## 2018-06-30 MED ORDER — ONDANSETRON HCL 4 MG/2ML IJ SOLN: 4.0000 mg | Freq: Once | INTRAMUSCULAR | Status: DC | PRN

## 2018-06-30 MED ORDER — LEVOTHYROXINE SODIUM 112 MCG PO TABS
112.0000 ug | ORAL_TABLET | Freq: Every day | ORAL | Status: DC
  Administered 2018-07-01 – 2018-07-03 (×3): 112 ug via ORAL
  Filled 2018-06-30 (×4): qty 1

## 2018-06-30 MED ORDER — SODIUM CHLORIDE 0.9 % IR SOLN
Status: DC | PRN
  Administered 2018-06-30: 1000 mL

## 2018-06-30 MED ORDER — SCOPOLAMINE 1 MG/3DAYS TD PT72
MEDICATED_PATCH | TRANSDERMAL | Status: DC | PRN
  Administered 2018-06-30: 1 via TRANSDERMAL

## 2018-06-30 MED ORDER — LACTATED RINGERS IV BOLUS
1000.0000 mL | Freq: Once | INTRAVENOUS | Status: AC
  Administered 2018-06-30: 1000 mL via INTRAVENOUS

## 2018-06-30 MED ORDER — FENTANYL CITRATE (PF) 100 MCG/2ML IJ SOLN: 25.0000 ug | INTRAMUSCULAR | Status: DC | PRN

## 2018-06-30 MED ORDER — KETOROLAC TROMETHAMINE 30 MG/ML IJ SOLN
INTRAMUSCULAR | Status: AC
  Filled 2018-06-30: qty 1

## 2018-06-30 MED ORDER — DIPHENHYDRAMINE HCL 50 MG/ML IJ SOLN: 12.5000 mg | INTRAMUSCULAR | Status: DC | PRN

## 2018-06-30 MED ORDER — ONDANSETRON HCL 4 MG/2ML IJ SOLN
INTRAMUSCULAR | Status: AC
  Filled 2018-06-30: qty 2

## 2018-06-30 MED ORDER — COCONUT OIL OIL
1.0000 "application " | TOPICAL_OIL | Status: DC | PRN
  Administered 2018-06-30: 1 via TOPICAL

## 2018-06-30 MED ORDER — ONDANSETRON HCL 4 MG/2ML IJ SOLN
INTRAMUSCULAR | Status: DC | PRN
  Administered 2018-06-30: 4 mg via INTRAVENOUS

## 2018-06-30 MED ORDER — MORPHINE SULFATE (PF) 0.5 MG/ML IJ SOLN
INTRAMUSCULAR | Status: DC | PRN
  Administered 2018-06-30: .15 mg via INTRATHECAL

## 2018-06-30 SURGICAL SUPPLY — 37 items
BENZOIN TINCTURE PRP APPL 2/3 (GAUZE/BANDAGES/DRESSINGS) ×3 IMPLANT
CANISTER SUCT 3000ML PPV (MISCELLANEOUS) ×3 IMPLANT
CHLORAPREP W/TINT 26ML (MISCELLANEOUS) ×3 IMPLANT
CLOSURE STERI STRIP 1/2 X4 (GAUZE/BANDAGES/DRESSINGS) ×3 IMPLANT
DRSG OPSITE POSTOP 4X10 (GAUZE/BANDAGES/DRESSINGS) ×3 IMPLANT
ELECT REM PT RETURN 9FT ADLT (ELECTROSURGICAL) ×3
ELECTRODE REM PT RTRN 9FT ADLT (ELECTROSURGICAL) ×1 IMPLANT
EXTRACTOR VACUUM KIWI (MISCELLANEOUS) ×3 IMPLANT
GLOVE BIOGEL PI IND STRL 7.0 (GLOVE) ×2 IMPLANT
GLOVE BIOGEL PI IND STRL 7.5 (GLOVE) ×1 IMPLANT
GLOVE BIOGEL PI INDICATOR 7.0 (GLOVE) ×4
GLOVE BIOGEL PI INDICATOR 7.5 (GLOVE) ×2
GLOVE SKINSENSE NS SZ7.0 (GLOVE) ×2
GLOVE SKINSENSE STRL SZ7.0 (GLOVE) ×1 IMPLANT
GOWN STRL REUS W/ TWL LRG LVL3 (GOWN DISPOSABLE) ×2 IMPLANT
GOWN STRL REUS W/ TWL XL LVL3 (GOWN DISPOSABLE) ×1 IMPLANT
GOWN STRL REUS W/TWL LRG LVL3 (GOWN DISPOSABLE) ×4
GOWN STRL REUS W/TWL XL LVL3 (GOWN DISPOSABLE) ×2
HOVERMATT SINGLE USE (MISCELLANEOUS) ×3 IMPLANT
NS IRRIG 1000ML POUR BTL (IV SOLUTION) ×3 IMPLANT
PACK C SECTION WH (CUSTOM PROCEDURE TRAY) ×3 IMPLANT
PAD ABD 7.5X8 STRL (GAUZE/BANDAGES/DRESSINGS) ×3 IMPLANT
PAD OB MATERNITY 4.3X12.25 (PERSONAL CARE ITEMS) ×3 IMPLANT
PAD PREP 24X48 CUFFED NSTRL (MISCELLANEOUS) ×3 IMPLANT
PENCIL SMOKE EVAC W/HOLSTER (ELECTROSURGICAL) ×3 IMPLANT
RETRACTOR WND ALEXIS 25 LRG (MISCELLANEOUS) ×1 IMPLANT
RTRCTR WOUND ALEXIS 25CM LRG (MISCELLANEOUS) ×3
STRIP CLOSURE SKIN 1/2X4 (GAUZE/BANDAGES/DRESSINGS) ×2 IMPLANT
SUT MNCRL 0 VIOLET CTX 36 (SUTURE) ×2 IMPLANT
SUT MON AB 4-0 PS1 27 (SUTURE) ×3 IMPLANT
SUT MONOCRYL 0 CTX 36 (SUTURE) ×4
SUT PLAIN 2 0 XLH (SUTURE) ×3 IMPLANT
SUT VIC AB 0 CT1 36 (SUTURE) ×6 IMPLANT
SUT VIC AB 3-0 CT1 27 (SUTURE) ×2
SUT VIC AB 3-0 CT1 TAPERPNT 27 (SUTURE) ×1 IMPLANT
TOWEL OR 17X24 6PK STRL BLUE (TOWEL DISPOSABLE) ×6 IMPLANT
WATER STERILE IRR 1000ML POUR (IV SOLUTION) ×3 IMPLANT

## 2018-06-30 NOTE — Transfer of Care (Signed)
Immediate Anesthesia Transfer of Care Note  Patient: Leah Howard  Procedure(s) Performed: CESAREAN SECTION (N/A )  Patient Location: PACU  Anesthesia Type:Spinal  Level of Consciousness: awake, alert  and patient cooperative  Airway & Oxygen Therapy: Patient Spontanous Breathing  Post-op Assessment: Report given to RN and Post -op Vital signs reviewed and stable  Post vital signs: Reviewed and stable  Last Vitals:  Vitals Value Taken Time  BP 107/58 06/30/18 1300  Temp    Pulse 86 06/30/18 1302  Resp 25 06/30/18 1302  SpO2 100 % 06/30/18 1302  Vitals shown include unvalidated device data.  Last Pain:  Vitals:   06/30/18 1300  TempSrc: Oral         Complications: No apparent anesthesia complications

## 2018-06-30 NOTE — Anesthesia Procedure Notes (Signed)
Spinal  Patient location during procedure: OR Start time: 06/30/2018 11:32 AM End time: 06/30/2018 11:36 AM Staffing Anesthesiologist: Janeece Riggers, MD Preanesthetic Checklist Completed: patient identified, site marked, surgical consent, pre-op evaluation, timeout performed, IV checked, risks and benefits discussed and monitors and equipment checked Spinal Block Patient position: sitting Prep: DuraPrep Patient monitoring: heart rate, cardiac monitor, continuous pulse ox and blood pressure Approach: midline Location: L3-4 Injection technique: single-shot Needle Needle type: Sprotte  Needle gauge: 24 G Needle length: 9 cm Assessment Sensory level: T4

## 2018-06-30 NOTE — Procedures (Signed)
External Cephalic Version Procedure Note  Pre-operative Diagnosis: 37/0 weeks. Complete breech. Gestational HTN. Rh positive  Post-operative Diagnosis: Same. S/p failed ECV  Procedure Details  Terbutaline given approximately 10-15 minutes before procedure. Repeat bedside ultrasound done and fetal head now at the 12 o'clock position, with back still going down right side of maternal abdomen and still complete breech.   Three attempts to do a forward roll were done, with each attempt lasting approximately 30-60 seconds. Normal fetal heart rate in between attempts. Unable to disengage presenting part and get fetal head to move any. Procedure discontinued.  Patient Vitals for the past 6 hrs:  BP Pulse Resp Height Weight  06/30/18 1001 131/73 96 - - -  06/30/18 0946 130/77 95 - - -  06/30/18 0931 132/69 94 - - -  06/30/18 0916 (!) 145/72 97 20 - -  06/30/18 0903 - - - 5\' 4"  (1.626 m) 94.8 kg  06/30/18 0900 126/69 94 18 - -  06/30/18 0845 (!) 144/80 97 18 - -  06/30/18 0828 (!) 144/71 89 18 - -  06/30/18 0812 (!) 145/74 97 18 - -   CBC, CMP and PC ratio were all normal.   Condition: Stable  Complications: None  Plan: Given her BPs here, yesterday and earlier this week, I told her I would classify her as gestational HTN and given that she is 37wks I recommend delivery via c-section. She is fine with this. D/w OR re: non urgent primary c-section   Durene Romans MD Attending [facility])

## 2018-06-30 NOTE — Anesthesia Postprocedure Evaluation (Signed)
Anesthesia Post Note  Patient: Leah Howard  Procedure(s) Performed: CESAREAN SECTION (N/A )     Patient location during evaluation: PACU Anesthesia Type: Spinal Level of consciousness: oriented and awake and alert Pain management: pain level controlled Vital Signs Assessment: post-procedure vital signs reviewed and stable Respiratory status: spontaneous breathing, respiratory function stable and patient connected to nasal cannula oxygen Cardiovascular status: blood pressure returned to baseline and stable Postop Assessment: no headache, no backache and no apparent nausea or vomiting Anesthetic complications: no    Last Vitals:  Vitals:   06/30/18 1513 06/30/18 1615  BP: 124/71 126/69  Pulse: 68 72  Resp: 18 16  Temp: 36.9 C 37 C  SpO2: 100% 100%    Last Pain:  Vitals:   06/30/18 1615  TempSrc: Oral  PainSc: 0-No pain   Pain Goal:                   Blue Winther

## 2018-06-30 NOTE — Anesthesia Preprocedure Evaluation (Signed)
Anesthesia Evaluation  Patient identified by MRN, date of birth, ID band Patient awake    Reviewed: Allergy & Precautions, H&P , NPO status , Patient's Chart, lab work & pertinent test results, reviewed documented beta blocker date and time   Airway Mallampati: II  TM Distance: >3 FB Neck ROM: full    Dental no notable dental hx.    Pulmonary neg pulmonary ROS,    Pulmonary exam normal breath sounds clear to auscultation       Cardiovascular negative cardio ROS Normal cardiovascular exam Rhythm:regular Rate:Normal     Neuro/Psych negative neurological ROS  negative psych ROS   GI/Hepatic negative GI ROS, Neg liver ROS,   Endo/Other  Hypothyroidism Morbid obesity  Renal/GU negative Renal ROS  negative genitourinary   Musculoskeletal   Abdominal   Peds  Hematology  (+) Blood dyscrasia, anemia ,   Anesthesia Other Findings   Reproductive/Obstetrics (+) Pregnancy                             Anesthesia Physical Anesthesia Plan  ASA: III and emergent  Anesthesia Plan: Spinal   Post-op Pain Management:    Induction:   PONV Risk Score and Plan: 2 and Scopolamine patch - Pre-op  Airway Management Planned: Nasal Cannula  Additional Equipment:   Intra-op Plan:   Post-operative Plan:   Informed Consent: I have reviewed the patients History and Physical, chart, labs and discussed the procedure including the risks, benefits and alternatives for the proposed anesthesia with the patient or authorized representative who has indicated his/her understanding and acceptance.       Plan Discussed with: CRNA, Anesthesiologist and Surgeon  Anesthesia Plan Comments: (  )        Anesthesia Quick Evaluation

## 2018-06-30 NOTE — Op Note (Signed)
Operative Note   SURGERY DATE: 06/30/2018  PRE-OP DIAGNOSIS:  *Pregnancy at 37/0 *Gestational HTN *Breech  POST-OP DIAGNOSIS: Same. Delivered   PROCEDURE: primary low transverse cesarean section via pfannenstiel skin incision with double layer uterine closure  SURGEON: Surgeon(s) and Role:    Aletha Halim, MD - Primary  ASSISTANT: None  ANESTHESIA: spinal  ESTIMATED BLOOD LOSS: 563mL  DRAINS: 188mL UOP via indwelling foley  TOTAL IV FLUIDS: 2771mL crystalloid  VTE PROPHYLAXIS: SCDs to bilateral lower extremities  ANTIBIOTICS: Two grams of Cefazolin were given., within 1 hour of skin incision  SPECIMENS: none  COMPLICATIONS: none  FINDINGS: No intra-abdominal adhesions were noted. Grossly normal uterus, tubes and ovaries. Clear amniotic fluid, complete breech, female infant, weight 3075gm, APGARs 8/9, intact placenta.  PROCEDURE IN DETAIL: The patient was taken to the operating room where anesthesia was administered and normal fetal heart tones were confirmed. She was then prepped and draped in the normal fashion in the dorsal supine position with a leftward tilt.  After a time out was performed, a pfannensteil skin incision was made with the scalpel and carried through to the underlying layer of fascia. The fascia was then incised at the midline and this incision was extended laterally with the mayo scissors. Attention was turned to the superior aspect of the fascial incision which was grasped with the kocher clamps x 2, tented up and the rectus muscles were dissected off with the bovie. In a similar fashion the inferior aspect of the fascial incision was grasped with the kocher clamps, tented up and the rectus muscles dissected off with the mayo scissors. The rectus muscles were then separated in the midline and the peritoneum was entered bluntly. The bladder blade was inserted and the vesicouterine peritoneum was identified, tented up and entered with the metzenbaum  scissors. This incision was extended laterally and the bladder flap was created digitally. The bladder blade was reinserted.  A low transverse hysterotomy was made with the scalpel until the endometrial cavity was breached and the amniotic sac ruptured with the Allis clamp, yielding clear amniotic fluid. This incision was extended bluntly and the infants legs and hips delivered through the hysterotomy. The remainder of the baby was delivered using standard breech maneuvers with care to avoid extension of the head.The cord was clamped x 2 and cut, and the infant was handed to the awaiting pediatricians, after delayed cord clamping was done.  The placenta was then gradually expressed from the uterus and then the uterus was exteriorized and cleared of all clots and debris. The hysterotomy was repaired with a running suture of 1-0 monocryl. A second imbricating layer of 1-0 monocryl suture was then placed to achieve excellent hemostasis.   The uterus and adnexa were then returned to the abdomen, and the hysterotomy and all operative sites were reinspected and excellent hemostasis was noted after irrigation and suction of the abdomen with warm saline.  The peritoneum was closed with a running stitch of 3-0 Vicryl. The fascia was reapproximated with 0 Vicryl in a simple running fashion bilaterally. The subcutaneous layer was then reapproximated with interrupted sutures of 2-0 plain gut, and the skin was then closed with 4-0 monocryl, in a subcuticular fashion.  The patient  tolerated the procedure well. Sponge, lap, needle, and instrument counts were correct x 2. The patient was transferred to the recovery room awake, alert and breathing independently in stable condition.  Durene Romans MD Attending Center for [facility])

## 2018-06-30 NOTE — H&P (Signed)
Obstetrics Admission History & Physical  06/30/2018 - 8:26 AM Primary OBGYN: [facility]  Chief Complaint: malpresentation  History of Present Illness  23 y.o. G1P0000 @ [redacted]w[redacted]d, with the above CC. Pregnancy complicated by: BMI 29N, subclinical hypothyroidism, h/o PCOS, GBS pos.  Ms. Leah Howard states that she has no s/s of labor or decreased FM but doesn't feel that baby has flipped. Pt denies any s/s of pre-eclampsia. B positive.   Review of Systems: as noted in the History of Present Illness.  PMHx:  Past Medical History:  Diagnosis Date  . Anemia   . Hypothyroidism   . PCOS (polycystic ovarian syndrome)    PSHx:  Past Surgical History:  Procedure Laterality Date  . WISDOM TOOTH EXTRACTION     Medications:  Medications Prior to Admission  Medication Sig Dispense Refill Last Dose  . levothyroxine (SYNTHROID) 112 MCG tablet Take 1 tablet (112 mcg total) by mouth daily before breakfast. 30 tablet 3   . Prenatal Multivit-Min-Fe-FA (PRENATAL VITAMINS PO) Take by mouth.        Allergies: has No Known Allergies. OBHx:  OB History  Gravida Para Term Preterm AB Living  1 0 0 0 0 0  SAB TAB Ectopic Multiple Live Births  0 0 0 0 0    # Outcome Date GA Lbr Len/2nd Weight Sex Delivery Anes PTL Lv  1 Current                   FHx:  Family History  Problem Relation Age of Onset  . Hyperlipidemia Father   . Diabetes Paternal Grandmother   . Arthritis Paternal Grandfather   . Diabetes Paternal Grandfather    Soc Hx:  Social History   Socioeconomic History  . Marital status: Significant Other    Spouse name: Not on file  . Number of children: Not on file  . Years of education: Not on file  . Highest education level: Not on file  Occupational History  . Not on file  Social Needs  . Financial resource strain: Not on file  . Food insecurity    Worry: Not on file    Inability: Not on file  . Transportation needs    Medical: Not on file     Non-medical: Not on file  Tobacco Use  . Smoking status: Never Smoker  . Smokeless tobacco: Never Used  Substance and Sexual Activity  . Alcohol use: Never    Frequency: Never  . Drug use: Never  . Sexual activity: Yes  Lifestyle  . Physical activity    Days per week: Not on file    Minutes per session: Not on file  . Stress: Not on file  Relationships  . Social Herbalist on phone: Not on file    Gets together: Not on file    Attends religious service: Not on file    Active member of club or organization: Not on file    Attends meetings of clubs or organizations: Not on file    Relationship status: Not on file  . Intimate partner violence    Fear of current or ex partner: Not on file    Emotionally abused: Not on file    Physically abused: Not on file    Forced sexual activity: Not on file  Other Topics Concern  . Not on file  Social History Narrative  . Not on file    Objective   Vitals:   06/30/18  0812  BP: (!) 145/74  Pulse: 97  Resp: 18     Current Vital Signs 24h Vital Sign Ranges  T   No data recorded  BP (!) 145/74 BP  Min: 129/70  Max: 145/74  HR 97 Pulse  Avg: 97  Min: 97  Max: 97  RR 18 Resp  Avg: 18  Min: 18  Max: 18  SaO2     No data recorded       24 Hour I/O Current Shift I/O  Time Ins Outs No intake/output data recorded. No intake/output data recorded.   NST: just getting started.   General: Well nourished, well developed female in no acute distress.  Skin:  Warm and dry.  Cardiovascular: S1, S2 normal, no murmur, rub or gallop, regular rate and rhythm Respiratory:  Clear to auscultation bilateral. Normal respiratory effort Abdomen: gravid, nttp Neuro/Psych:  Normal mood and affect.   Labs  Recent Labs  Lab 06/24/18 1129  WBC 9.3  HGB 12.0  HCT 38.6  PLT 244    Recent Labs  Lab 06/24/18 1129  NA 138  K 4.3  CL 107*  CO2 19*  BUN 5*  CREATININE 0.51*  CALCIUM 8.8  PROT 6.2  BILITOT <0.2  ALKPHOS 132*   ALT 13  AST 23  GLUCOSE 93   covid test: neg  Radiology Bedside u/s Head in the ruq to 10 o'clock, back wraps around to luq to the llq, appears complete breech with butt in the suprapubic area. AFI subjectively normal, normal FHR, +incidental breathing.   5/22: breech, 45%, 1976g, normal AC Assessment & Plan   22 y.o. G1P0000 @ 5872w0d with breech, pt doing well. ?gHTN *Pregnancy: follow up NST *Breech: d/w her and partner re: r/b/a to ECV, namely abruption, fetal distress, need for stat delivery and she would like to try. Once cbc and type and screen are back, will give terb 10 minutes prior to attempt *?gHTN: will get more BPs and do labs. Had some elevated bps but re checks were normal in the past 7-10d. If consistently elevated here or labs abnormal, d/w her that would give her dx of gHTN and would need delivery (IOL if ecv successful or c/s) *GBS: pos *Analgesia: no current needs  Cornelia Copaharlie Vitaly Wanat, Jr. MD Attending [facility](Faculty Practice)

## 2018-07-01 ENCOUNTER — Encounter (HOSPITAL_COMMUNITY): Payer: Self-pay

## 2018-07-01 ENCOUNTER — Ambulatory Visit (HOSPITAL_COMMUNITY): Payer: No Typology Code available for payment source

## 2018-07-01 ENCOUNTER — Encounter (HOSPITAL_COMMUNITY): Payer: Self-pay | Admitting: Family Medicine

## 2018-07-01 ENCOUNTER — Encounter: Payer: No Typology Code available for payment source | Admitting: Family Medicine

## 2018-07-01 ENCOUNTER — Ambulatory Visit (HOSPITAL_COMMUNITY)
Admission: RE | Admit: 2018-07-01 | Discharge: 2018-07-01 | Disposition: A | Discharge status: Home / Self Care | Payer: No Typology Code available for payment source | Source: Ambulatory Visit | Attending: Obstetrics and Gynecology | Admitting: Obstetrics and Gynecology

## 2018-07-01 LAB — RPR: RPR Ser Ql: NONREACTIVE

## 2018-07-01 LAB — CBC
HCT: 28.3 % — ABNORMAL LOW (ref 36.0–46.0)
Hemoglobin: 9.2 g/dL — ABNORMAL LOW (ref 12.0–15.0)
MCH: 26.6 pg (ref 26.0–34.0)
MCHC: 32.5 g/dL (ref 30.0–36.0)
MCV: 81.8 fL (ref 80.0–100.0)
Platelets: 198 K/uL (ref 150–400)
RBC: 3.46 MIL/uL — ABNORMAL LOW (ref 3.87–5.11)
RDW: 13.9 % (ref 11.5–15.5)
WBC: 10.5 K/uL (ref 4.0–10.5)
nRBC: 0 % (ref 0.0–0.2)

## 2018-07-01 LAB — BIRTH TISSUE RECOVERY COLLECTION (PLACENTA DONATION)

## 2018-07-01 NOTE — Lactation Note (Signed)
This note was copied from a baby's chart. Lactation Consultation Note  Patient Name: Leah Howard Today's Date: 07/01/2018   Spoke with P1 Mom of 37 week baby at 70 hrs old, about calling for assistance with next feeding.   Baby dressed and swaddled in blanket sleeping.  Hearing screen about to start.    Mom has DEBP at bedside.  Mom has used hand pump, but not double pump yet.  Baby has had one supplement of formula by bottle about an hour ago.    To call for assistance for next feeding.  Broadus John 07/01/2018, 11:54 AM

## 2018-07-01 NOTE — Lactation Note (Addendum)
This note was copied from a baby's chart. Lactation Consultation Note  Patient Name: Girl Alyzza Andringa Today's Date: 07/01/2018  P1, 41 hour female infant, ETI, -4% weight loss, C/S delivery and infant with hx of emesis within first 24 hours of life. Per mom, infant was given 20 ml of Similac Advance with iron prior to one hour when LC entered the room. Infant was asleep in basinet LC did not observed a latch at this time.  Per mom, she has used DEBP 3 times today. LC reviewed hand expression with mom and mom has 50ml of EBM for future feeding.  Mom plans to latch infant to breast and then supplement with EBM/ and or formula based on infant's age/ hours of life. Mom will hand express and use DEBP after feeding infant at breast.  Mom will start pumping every 3 hours for 15 minutes on initial setting. Mom will call Nurse or LC for latch assistance if needed.    Maternal Data    Feeding    LATCH Score                   Interventions    Lactation Tools Discussed/Used     Consult Status      Vicente Serene 07/01/2018, 9:14 PM

## 2018-07-01 NOTE — Lactation Note (Signed)
This note was copied from a baby's chart. Lactation Consultation Note Baby 22 hrs old.  Mom stated baby latched well 2 times since birth, has not been interested since. Baby has been spitting some clear mucous. Mom holding baby STS. Attempted to latching in cross cradle position. Baby noted interested, will not open mouth. Mom has "V" shaped breast w/approx 2 fingers width between breast. Hand expression taught w/easy flow of colostrum. Mom demonstrated hand expression. Attempted to spoon feed baby. Baby wasn't interested in spoon feeding. Breast has some edema to nipple. Encouraged to finger roll and hand express to reduce edema and soften tissue. Shells given for mom to wear today. Hand pump given to pre-pump before latching. Mom shown how to use DEBP & how to disassemble, clean, & reassemble parts. Mom knows to pump q3h for 15-20 min. Mom has Hx; PCOS and Hypothyroidism. Pumping encouraged. Mom encouraged to waken baby for feeds if hasn't cued in 3 hrs. Mom encouraged to feed baby 8-12 times/24 hours and with feeding cues.  Encouraged mom to give baby colostrum from pumping and hand expression. Newborn behavior, STS, I&O, ETI behavior, feeding positioning, support, breast massage, supply and demand discussed. Encouraged mom to alert RN if baby isn't feeding well. Call for assistance as needed. Lactation brochure given. Mom is [employer] Employee wanting DEBP. Mom doesn't have her Insurance card at this time. Will send FOB to get card before d/c home.   Patient Name: Leah Howard [id number] Date: 07/01/2018 Reason for consult: Initial assessment;Maternal endocrine disorder;1st time breastfeeding;Early term 37-38.6wks Type of Endocrine Disorder?: Thyroid(PCOS)   Maternal Data Has patient been taught Hand Expression?: Yes Does the patient have breastfeeding experience prior to this delivery?: No  Feeding    LATCH Score Latch: Too sleepy or reluctant, no latch achieved, no sucking  elicited.  Audible Swallowing: None  Type of Nipple: Everted at rest and after stimulation  Comfort (Breast/Nipple): Soft / non-tender  Hold (Positioning): Full assist, staff holds infant at breast  LATCH Score: 4  Interventions Interventions: Breast feeding basics reviewed;Adjust position;DEBP;Assisted with latch;Support pillows;Skin to skin;Position options;Breast massage;Expressed milk;Hand express;Coconut oil;Pre-pump if needed;Shells;Breast compression;Hand pump;Comfort gels  Lactation Tools Discussed/Used Tools: Shells;Pump Shell Type: Inverted Breast pump type: Double-Electric Breast Pump;Manual WIC Program: No Pump Review: Setup, frequency, and cleaning;Milk Storage Initiated by:: Allayne Stack RN IBCLC Date initiated:: 07/01/18   Consult Status Consult Status: Follow-up Date: 07/01/18 Follow-up type: In-patient    Theodoro Kalata 07/01/2018, 4:15 AM

## 2018-07-01 NOTE — Progress Notes (Signed)
Post Op Day 1  Subjective Up ad lib, voiding, tolerating PO, and + flatus. Denies dizziness, lightheadedness, tachycardia. Appropriate Lochia. Pain well controlled (1-2/10)  Objective BP 113/65 (BP Location: Right Arm)   Pulse 69   Temp 98.4 F (36.9 C) (Oral)   Resp 20   Ht 5\' 4"  (1.626 m)   Wt 94.8 kg   LMP 10/14/2017 (Exact Date)   SpO2 100%   Breastfeeding Unknown   BMI 35.87 kg/m  Intake/Output      06/18 0701 - 06/19 0700 06/19 0701 - 06/20 0700   P.O. 480    I.V. (mL/kg) 3537.5 (37.3)    Total Intake(mL/kg) 4017.5 (42.4)    Urine (mL/kg/hr) 1450    Blood 579    Total Output 2029    Net +1988.5          Physical Exam:  General: alert, cooperative, appears stated age, fatigued, and no distress Lungs: CTAB, no crackles or wheezes Heart: RRR, no m/r/r.  Lochia: appropriate Uterine Fundus: firm Incision: healing well, no significant drainage, no dehiscence, no significant erythema DVT Evaluation: No evidence of DVT seen on physical exam, extremities warm and well perfused.   Recent Labs    06/30/18 0831 07/01/18 0450  HGB 11.8* 9.2*  HCT 36.1 28.3*    Assessment & Plan Post Op Day # 1 Blood pressures wnl overnight  . Plan for possible d/c tomorrow  . Continue daily lactation  . Contraception: discussed and patient desires to undecided .     LOS: 1 day   Leah Howard 07/01/2018, 7:52 AM

## 2018-07-02 ENCOUNTER — Encounter (HOSPITAL_COMMUNITY): Payer: Self-pay | Admitting: Obstetrics and Gynecology

## 2018-07-02 MED ORDER — OXYCODONE HCL 5 MG PO TABS: 5.0000 mg | ORAL_TABLET | Freq: Four times a day (QID) | ORAL | 0 refills | Status: DC | PRN

## 2018-07-02 MED ORDER — IBUPROFEN 800 MG PO TABS: 800.0000 mg | ORAL_TABLET | Freq: Three times a day (TID) | ORAL | 0 refills | Status: AC | PRN

## 2018-07-02 NOTE — Lactation Note (Addendum)
This note was copied from a baby's chart. Lactation Consultation Note  Patient Name: Leah Howard FAOZH'Y Date: 07/02/2018 Reason for consult: Follow-up assessment;Maternal endocrine disorder;Early term 86-38.6wks;Infant weight loss;1st time breastfeeding;Primapara Type of Endocrine Disorder?: Thyroid(hypothyroid and PCOS)  2 hours old ETI female who is now being partially BF and formula fed by her mother, she's a P1. Even though mom had a Hx of hyperthyroidism and PCOS her milk is coming in, she was able to pump 10 ml in her last pumping session and she's been spoon feeding it to baby. Now that mom is pumping volume she's going to keep supplementing baby using a bottle with a slow flow nipple. Mom understands her colostrum will be fed first prior doing any formula.   Baby still very sleepy after NP Lauren finished with her exam. LC had to wake baby up to feed and assisted mom with the football hold, baby crying and first and not latching but after doing lots of breast massage and breast compressions she started nursing with audible swallows heard. Mom did teach back, she did an excellent job and is very motivated now that she's getting more colostrum. Baby nursed for 12 minutes. Urged her to pump every 3 hours, her breast are filling up. After baby was done feeding at the breast, LC showed parents the pace feeding technique and baby was able to finish the 10 ml of colostrum. Dad will be offering another 15 ml of formula when baby is done burping.  Mom understands that baby may not be able to fully empty the breast at this point and that she'll need to pump in order to avoid engorgement. [place] issued her [employer] employee pump, and also instructed mom how to convert her DEBP hospital kit into a spare one for her hospital pump. Reminded mom she needs to wash her pump parts after each pumping session and that she has spare pieces in case there is an issue with suction. Reviewed supplementation  guidelines, normal newborn behavior, engorgement prevention and treatment and newborn weight loss; baby is at 9%.  Feeding plan:  1. Encouraged mom to feed baby STS 8-12 times/24 hours or sooner if feeding cues are present 2. Mom aware to limit feedings at the breast to no more than 30 minutes at a time 3. She'll pump every 3 hours after feedings and will offer any amount of EBM first prior formula 4. Parents aware that volumes for supplementation will slightly go up as baby gets older, she's at 18-25 ml per feeding as today  Parents reported all questions and concerns were answered, they're both aware of [place] services and will call PRN.  Maternal Data    Feeding Feeding Type: Breast Milk  LATCH Score Latch: Repeated attempts needed to sustain latch, nipple held in mouth throughout feeding, stimulation needed to elicit sucking reflex.  Audible Swallowing: A few with stimulation(with breast compressions)  Type of Nipple: Everted at rest and after stimulation  Comfort (Breast/Nipple): Soft / non-tender  Hold (Positioning): Assistance needed to correctly position infant at breast and maintain latch.  LATCH Score: 7  Interventions Interventions: Breast feeding basics reviewed;Assisted with latch;Skin to skin;Breast massage;Hand express;Adjust position;Breast compression;DEBP;Support pillows;Position options;Expressed milk  Lactation Tools Discussed/Used Tools: Bottle(with slow flow nipple)   Consult Status Consult Status: Follow-up Date: 07/03/18 Follow-up type: In-patient    Leah Howard Leah Howard 07/02/2018, 2:08 PM

## 2018-07-02 NOTE — Discharge Instructions (Signed)

## 2018-07-02 NOTE — Discharge Summary (Signed)
OB Discharge Summary     Patient Name: Leah Howard DOB: [birth year] MRN: [id number]  Date of admission: 06/30/2018 Delivering MD: Aletha Halim   Date of discharge: 07/02/2018  Admitting diagnosis: pregnancy Intrauterine pregnancy: [redacted]w[redacted]d     Secondary diagnosis:  Active Problems:   Subclinical hypothyroidism   Malpresentation before onset of labor   PCOS (polycystic ovarian syndrome)   GBS (group B Streptococcus carrier), +RV culture, currently pregnant   Gestational hypertension   Malpresentation of fetus  Additional problems: none     Discharge diagnosis: Term Pregnancy Delivered and Gestational Hypertension                                                                                                Post partum procedures:none  Augmentation: N/A  Complications: None  Hospital course:  Scheduled C/S   23 y.o. yo G1P1001 at [redacted]w[redacted]d was admitted to the hospital 06/30/2018 for scheduled cesarean section with the following indication:Malpresentation and in setting of new onset gHTN at 37wks. Pt had initially presented for elective ECV due to breech presentation. It was noted that her BPs were elevated with normal labs. Attempt was made at ECV- unsuccessful. Due to gHTN, non urgent pLTCS was performed (see op note for details). Membrane Rupture Time/Date: 12:01 PM ,06/30/2018   Patient delivered a Viable infant.06/30/2018  Details of operation can be found in separate operative note.  Patient had an uncomplicated postpartum course.  On POD#1 her hgb was 9.2 and had been 11.8 preop. She is ambulating, tolerating a regular diet, passing flatus, and urinating well. BPs remained normotensive. Patient is discharged home in stable condition on 07/02/18 per her request for early discharge as long as baby is stable for d/c as well.         Physical exam  Vitals:   07/01/18 0901 07/01/18 1355 07/01/18 2240 07/02/18 0556  BP: (!) 103/56 (!) 96/53 110/65 113/67  Pulse: 100 72 68 75   Resp: 18 16 18 20   Temp: 98.2 F (36.8 C) 98.4 F (36.9 C) 98.1 F (36.7 C) 97.8 F (36.6 C)  TempSrc: Oral Oral Oral Oral  SpO2: 100%   100%  Weight:      Height:       General: alert and cooperative Lochia: appropriate Uterine Fundus: firm Incision: honeycomb intact, dry DVT Evaluation: Negative Homan's sign. Labs: Lab Results  Component Value Date   WBC 10.5 07/01/2018   HGB 9.2 (L) 07/01/2018   HCT 28.3 (L) 07/01/2018   MCV 81.8 07/01/2018   PLT 198 07/01/2018   CMP Latest Ref Rng & Units 06/30/2018  Glucose 70 - 99 mg/dL 78  BUN 6 - 20 mg/dL 6  Creatinine 0.44 - 1.00 mg/dL 0.61  Sodium 135 - 145 mmol/L 139  Potassium 3.5 - 5.1 mmol/L 3.5  Chloride 98 - 111 mmol/L 111  CO2 22 - 32 mmol/L 19(L)  Calcium 8.9 - 10.3 mg/dL 8.9  Total Protein 6.5 - 8.1 g/dL 6.1(L)  Total Bilirubin 0.3 - 1.2 mg/dL 0.5  Alkaline Phos 38 - 126 U/L 116  AST 15 - 41 U/L  28  ALT 0 - 44 U/L 19    Discharge instruction: per After Visit Summary and "Baby and Me Booklet".  After visit meds:  Allergies as of 07/02/2018   No Known Allergies     Medication List    TAKE these medications   ibuprofen 800 MG tablet Commonly known as: ADVIL Take 1 tablet (800 mg total) by mouth every 8 (eight) hours as needed.   levothyroxine 112 MCG tablet Commonly known as: Synthroid Take 1 tablet (112 mcg total) by mouth daily before breakfast.   oxyCODONE 5 MG immediate release tablet Commonly known as: Oxy IR/ROXICODONE Take 1-2 tablets (5-10 mg total) by mouth every 6 (six) hours as needed for moderate pain.   PRENATAL VITAMINS PO Take by mouth.       Diet: routine diet  Activity: Advance as tolerated. Pelvic rest for 6 weeks.   Outpatient follow UJ:[id number]:incision and BP check in 1-2 wks, then 4-6wk PP visit Follow up Appt: Future Appointments  Date Time Provider Department Center  07/14/2018  2:30 PM Levie HeritageStinson, Jacob J, DO [facility] None  08/04/2018  2:45 PM Adrian BlackwaterStinson, Rhona RaiderJacob J, DO [facility] None    Follow up Visit:No follow-ups on file.  Postpartum contraception: OCPs vs POPs, depending on [facility] of [facility]  Newborn Data: Live born female  Birth Weight: 6 lb 12.5 oz (3075 g) APGAR: 8, 9  Newborn Delivery   Birth date/time: 06/30/2018 12:04:00 Delivery type: C-Section, Low Transverse Trial of labor: No C-section categorization: Primary      Baby Feeding: Bottle and Breast Disposition:home with mother   07/02/2018 Arabella MerlesKimberly D Kailin Principato, CNM  10:59 AM

## 2018-07-03 MED ORDER — DOCUSATE SODIUM 100 MG PO CAPS: 100.0000 mg | ORAL_CAPSULE | Freq: Two times a day (BID) | ORAL | 0 refills | Status: DC | PRN

## 2018-07-03 NOTE — Lactation Note (Signed)
This note was copied from a baby's chart. Lactation Consultation Note  Patient Name: Leah Howard [id number]'Z Date: 07/03/2018 Reason for consult: Follow-up assessment;Early term 37-38.6wks;Primapara;1st time breastfeeding  P1 mother whose infant is now 55 hours old.  This is an ETI with a 10% weight loss.  Mother requested latch assistance.  Mother's breasts are full.  It had been 3 hours since she last pumped.  Offered to assist with latch and mother accepted.  Positioned mother appropriately with pillow support and attempted to latch baby to breast.  Due to fullness of breast tissue baby was not able to latch effectively.  Repeated attempts made to latch.  Suggested mother use the DEBP to soften nipple/areola.  Mother easily pumped 15 mls and we attempted latching again.  At this time baby was not interested.  Fed her 12 mls with curved tip syringe and she fell asleep.  Mother will finish pumping now.  Suggested she may attempt latching earlier next time if breasts become too full or hand express a little off to soften nipple/areola.  Mother will continue to work on breast feeding at home.  Baby will be weighed soon to determine if discharge is appropriate.  Mother with no further questions/concerns.  Family desires discharge.   Maternal Data Formula Feeding for Exclusion: No Has patient been taught Hand Expression?: Yes Does the patient have breastfeeding experience prior to this delivery?: No  Feeding Feeding Type: Breast Milk  LATCH Score                   Interventions    Lactation Tools Discussed/Used WIC Program: No   Consult Status Consult Status: Complete Date: 07/03/18 Follow-up type: Call as needed    Anner Baity R Yan Okray 07/03/2018, 2:28 PM

## 2018-07-03 NOTE — Progress Notes (Signed)
Pt complaining of pain in her R & L breast, rating at a 4 on a scale of 0-10. Pt described pain as uncomfortable and stated her breast felt "hot". Upon inspection RN noted that both breast were hard and several hard lumps were felt. Earlier RN had encouraged pt to apply ice, massage, and hand express. Pt verbalized she was applying ice, massaging, and hand expressing but found no relief. Pt stated she had just hand expressed and had not expressed "much milk". RN hand expressed for patient and was able to express 71ml per breast. Pt stated she felt "much better", breast were not as swollen and her lumps had softened. At this point no more milk was able to be expressed. RN encouraged pt to repeat the process of ice, massaging, and hand expression. Encouraged pt to call for RN to assist with hand expression. Will continue to monitor.

## 2018-07-03 NOTE — Lactation Note (Signed)
This note was copied from a baby's chart. Lactation Consultation Note  Patient Name: Leah Howard JOITG'P Date: 07/03/2018 Reason for consult: Follow-up assessment;Early term 37-38.6wks;1st time breastfeeding;Primapara;Infant weight loss  P1 mother whose infant is now 33 hours old.  Baby is an ETI with a 10% weight loss.  Mother has been breast feeding and supplementing with EBM using a bottle and a slow flow nipple.  Early this morning her breasts were very full and painful with hard lumps.  Mother informed me that her RN assisted her with massage, hand expression and ice packs.  At my visit, mother's breasts were not engorged but were full.  Engorgement prevention/treatment discussed.  Mother will pump now with the DEBP and will include hand expression before/after pumping.  Parents are feeding back any EBM mother obtains with pumping.  She will continue to work on breast feeding and latching.  Mother will also post pump and continue supplementing with her EBM.  Discussed the importance of being diligent with feeding and pumping to help decrease baby's weight loss and to prevent engorgement.  Mother verbalized understanding.  Mother is familiar with the feeding supplementation guidelines.  She has our OP phone number for questions/concerns after discharge.  Mother has a manual pump and a DEBP for home use.  She was given her [employer] employee pump.  Father present.   Maternal Data Formula Feeding for Exclusion: No Has patient been taught Hand Expression?: Yes Does the patient have breastfeeding experience prior to this delivery?: No  Feeding    LATCH Score                   Interventions    Lactation Tools Discussed/Used WIC Program: No   Consult Status Consult Status: Complete Date: 07/03/18 Follow-up type: Call as needed    Leah Howard 07/03/2018, 10:40 AM

## 2018-07-03 NOTE — Discharge Summary (Signed)
OB Discharge Summary     Patient Name: Dia Donate DOB: [birth year] MRN: [id number]  Date of admission: 06/30/2018 Delivering MD: Aletha Halim   Date of discharge: 07/03/2018  Admitting diagnosis: pregnancy Intrauterine pregnancy: [redacted]w[redacted]d     Secondary diagnosis:  Active Problems:   Subclinical hypothyroidism   Malpresentation before onset of labor   PCOS (polycystic ovarian syndrome)   GBS (group B Streptococcus carrier), +RV culture, currently pregnant   Gestational hypertension   Malpresentation of fetus  Additional problems: none Discharge diagnosis: Term Pregnancy Delivered and Gestational Hypertension                               Postpartum procedures:none Complications: None  Hospital course:  Scheduled C/S   23 y.o. yo G1P1001 at [redacted]w[redacted]d was admitted to the hospital 06/30/2018 for scheduled cesarean section with the following indication:Malpresentation and in setting of new onset gHTN at 37wks. Pt had initially presented for elective ECV due to breech presentation. It was noted that her BPs were elevated with normal labs. Attempt was made at ECV- unsuccessful. Due to gHTN, non urgent pLTCS was performed (see op note for details). Membrane Rupture Time/Date: 12:01 PM ,06/30/2018   Patient delivered a Viable infant.06/30/2018  Details of operation can be found in separate operative note.  Patient had an uncomplicated postpartum course.  On POD#1 her hgb was 9.2 and had been 11.8 preop. She is ambulating, tolerating a regular diet, passing flatus, and urinating well. BPs remained normotensive. Patient is discharged home in stable condition on 07/03/18 .   Physical exam  Vitals:   07/02/18 0556 07/02/18 1511 07/02/18 2106 07/03/18 0500  BP: 113/67 114/61 123/70 129/80  Pulse: 75 61 63 72  Resp: 20 16 18 16   Temp: 97.8 F (36.6 C) 98.3 F (36.8 C) 98.2 F (36.8 C) 98 F (36.7 C)  TempSrc: Oral Oral Oral Oral  SpO2: 100% 100%    Weight:      Height:       General:  alert, well-appearing, NAD Lochia: appropriate Uterine Fundus: firm Incision: honeycomb intact, dry DVT Evaluation: no significant LE edema  Labs: Lab Results  Component Value Date   WBC 10.5 07/01/2018   HGB 9.2 (L) 07/01/2018   HCT 28.3 (L) 07/01/2018   MCV 81.8 07/01/2018   PLT 198 07/01/2018   CMP Latest Ref Rng & Units 06/30/2018  Glucose 70 - 99 mg/dL 78  BUN 6 - 20 mg/dL 6  Creatinine 0.44 - 1.00 mg/dL 0.61  Sodium 135 - 145 mmol/L 139  Potassium 3.5 - 5.1 mmol/L 3.5  Chloride 98 - 111 mmol/L 111  CO2 22 - 32 mmol/L 19(L)  Calcium 8.9 - 10.3 mg/dL 8.9  Total Protein 6.5 - 8.1 g/dL 6.1(L)  Total Bilirubin 0.3 - 1.2 mg/dL 0.5  Alkaline Phos 38 - 126 U/L 116  AST 15 - 41 U/L 28  ALT 0 - 44 U/L 19    Discharge instruction: per After Visit Summary and "Baby and Me Booklet".  After visit meds:  Allergies as of 07/03/2018   No Known Allergies     Medication List    TAKE these medications   docusate sodium 100 MG capsule Commonly known as: Colace Take 1 capsule (100 mg total) by mouth 2 (two) times daily as needed.   ibuprofen 800 MG tablet Commonly known as: ADVIL Take 1 tablet (800 mg total) by mouth every 8 (eight) hours as needed.  levothyroxine 112 MCG tablet Commonly known as: Synthroid Take 1 tablet (112 mcg total) by mouth daily before breakfast.   oxyCODONE 5 MG immediate release tablet Commonly known as: Oxy IR/ROXICODONE Take 1-2 tablets (5-10 mg total) by mouth every 6 (six) hours as needed for moderate pain.   PRENATAL VITAMINS PO Take by mouth.            Discharge Care Instructions  (From admission, onward)         Start     Ordered   07/03/18 0000  Discharge wound care:    Comments: Okay to shower like normal. Dressing can be removed after 1 week.   07/03/18 0723          Diet: routine diet Activity: Advance as tolerated. Pelvic rest for 6 weeks.   Outpatient follow WU:[id number]:incision and BP check in 1-2 wks, then 4-6wk PP  visit Follow up Appt: Future Appointments  Date Time Provider Department Center  07/14/2018  2:30 PM Levie HeritageStinson, Jacob J, DO [facility] None  08/04/2018  2:45 PM Adrian BlackwaterStinson, Rhona RaiderJacob J, DO [facility] None   Follow up Visit:No follow-ups on file. Postpartum contraception: OCPs vs POPs, [facility]  Newborn Data: Live born female  Birth Weight: 6 lb 12.5 oz (3075 g) APGAR: 8, 9  Newborn Delivery   Birth date/time: 06/30/2018 12:04:00 Delivery type: C-Section, Low Transverse Trial of labor: No C-section categorization: Primary     Baby Feeding: Bottle and Breast Disposition:home with mother or possibly rooming-in depending on infant weight loss  07/03/2018 Tamera StandsLaurel S Louie Meaders, DO  7:25 AM

## 2018-07-05 ENCOUNTER — Encounter: Payer: Self-pay | Admitting: Family Medicine

## 2018-07-05 ENCOUNTER — Encounter: Payer: No Typology Code available for payment source | Admitting: Advanced Practice Midwife

## 2018-07-06 ENCOUNTER — Telehealth: Payer: Self-pay | Admitting: Family Medicine

## 2018-07-06 NOTE — Telephone Encounter (Signed)
Pt left a message wanting to know if ok for pt to get TB TEST done since it is required for school, so pt can schedule a NV for TB test to be done. Please advise.

## 2018-07-06 NOTE — Telephone Encounter (Signed)
OK 

## 2018-07-12 ENCOUNTER — Other Ambulatory Visit: Payer: Self-pay

## 2018-07-12 ENCOUNTER — Ambulatory Visit (INDEPENDENT_AMBULATORY_CARE_PROVIDER_SITE_OTHER): Payer: No Typology Code available for payment source

## 2018-07-12 DIAGNOSIS — Z111 Encounter for screening for respiratory tuberculosis: Secondary | ICD-10-CM

## 2018-07-12 NOTE — Progress Notes (Signed)
Patient here for skin Tb testting. Tb purified protein 0.74ml injected intradermally on right fore arm with out any complications. Patient advised to come back Thursday for reading.

## 2018-07-14 ENCOUNTER — Other Ambulatory Visit: Payer: Self-pay

## 2018-07-14 ENCOUNTER — Encounter: Payer: Self-pay | Admitting: Family Medicine

## 2018-07-14 ENCOUNTER — Ambulatory Visit (INDEPENDENT_AMBULATORY_CARE_PROVIDER_SITE_OTHER): Payer: No Typology Code available for payment source | Admitting: Family Medicine

## 2018-07-14 VITALS — BP 118/69 | HR 77 | Ht 64.0 in | Wt 190.1 lb

## 2018-07-14 DIAGNOSIS — Z5189 Encounter for other specified aftercare: Secondary | ICD-10-CM

## 2018-07-14 LAB — TB SKIN TEST
Induration: NEGATIVE mm
TB Skin Test: NEGATIVE

## 2018-07-14 NOTE — Progress Notes (Signed)
   Subjective:    Patient ID: Leah Howard, female    DOB: February 14, 1995, 23 y.o.   MRN: [id number]  HPI  Seen for wound check - had C/s 2 weeks ago. No problems with incision  Review of Systems     Objective:   Physical Exam Constitutional:      Appearance: Normal appearance.  Cardiovascular:     Rate and Rhythm: Normal rate.  Pulmonary:     Effort: Pulmonary effort is normal.  Abdominal:     Comments: Incision clean, dry, intact.  Neurological:     Mental Status: She is alert.        Assessment & Plan:  1. Visit for wound check Good wound healing. F/u in 2 weeks.

## 2018-07-14 NOTE — Progress Notes (Signed)
Patient back for Tb skin test reading, it looks negative with out redness or indurations.

## 2018-08-04 ENCOUNTER — Other Ambulatory Visit: Payer: Self-pay

## 2018-08-04 ENCOUNTER — Ambulatory Visit (INDEPENDENT_AMBULATORY_CARE_PROVIDER_SITE_OTHER): Payer: No Typology Code available for payment source | Admitting: Family Medicine

## 2018-08-04 ENCOUNTER — Encounter: Payer: Self-pay | Admitting: Family Medicine

## 2018-08-04 VITALS — BP 107/57 | HR 52 | Ht 64.0 in | Wt 193.0 lb

## 2018-08-04 DIAGNOSIS — R399 Unspecified symptoms and signs involving the genitourinary system: Secondary | ICD-10-CM

## 2018-08-04 LAB — POCT URINALYSIS DIPSTICK
Blood, UA: NEGATIVE
Glucose, UA: NEGATIVE
Nitrite, UA: NEGATIVE
Protein, UA: NEGATIVE
Spec Grav, UA: 1.015 (ref 1.010–1.025)
pH, UA: 6 (ref 5.0–8.0)

## 2018-08-04 MED ORDER — SULFAMETHOXAZOLE-TRIMETHOPRIM 800-160 MG PO TABS: 1.0000 | ORAL_TABLET | Freq: Two times a day (BID) | ORAL | 1 refills | Status: AC

## 2018-08-04 MED FILL — SULFAMETHOXAZOLE-TMP DS TAB: 800-160 | 3 days supply | Qty: 6 | Fill #0

## 2018-08-04 NOTE — Progress Notes (Signed)
Subjective:     Leah Howard is a 23 y.o. female who presents for a postpartum visit. She is 5 weeks postpartum following a low cervical transverse Cesarean section. I have fully reviewed the prenatal and intrapartum course. The delivery was at 86 gestational weeks. Outcome: primary cesarean section, low transverse incision. Anesthesia: spinal. Postpartum course has been normal. Baby's course has been normal. Baby is feeding by breast. Bleeding no bleeding. Bowel function is occasional constipation . Bladder function is normal. Patient is not sexually active. Contraception method is undecided. Postpartum depression screening: negative.  The following portions of the patient's history were reviewed and updated as appropriate: allergies, current medications, past family history, past medical history, past social history, past surgical history and problem list.  Review of Systems Pertinent items are noted in HPI.   Objective:  BP (!) 107/57   Pulse (!) 52   Ht 5\' 4"  (1.626 m)   Wt 193 lb (87.5 kg)   LMP 10/14/2017 (Exact Date)   Breastfeeding Yes   BMI 33.13 kg/m    LMP 10/14/2017 (Exact Date)   General:  alert, cooperative and no distress  Lungs: clear to auscultation bilaterally  Heart:  regular rate and rhythm, S1, S2 normal, no murmur, click, rub or gallop  Abdomen: soft, non-tender; bowel sounds normal; no masses,  no organomegaly. Incision clean, dry, intact.        Assessment:     normal postpartum exam. Pap smear not done at today's visit.   Plan:    1. Contraception: undecided. Will call back 2. BP normal 3. Follow up in: 1 year or as needed.

## 2018-08-06 LAB — URINE CULTURE

## 2020-12-25 IMAGING — US US MFM OB FOLLOW UP
1 series · 13 of 28 positions shown · non-contrast
Comparison: none

[Series 1: us mfm ob follow up · 13 of 56 slices shown]
[im 3/56]
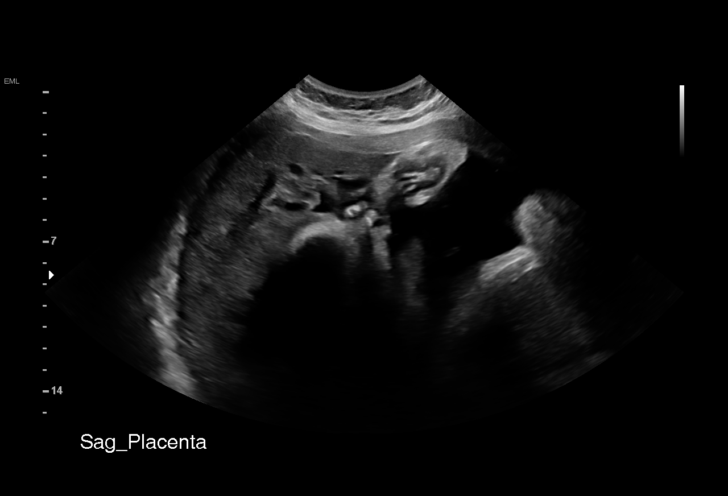
[im 7/56]
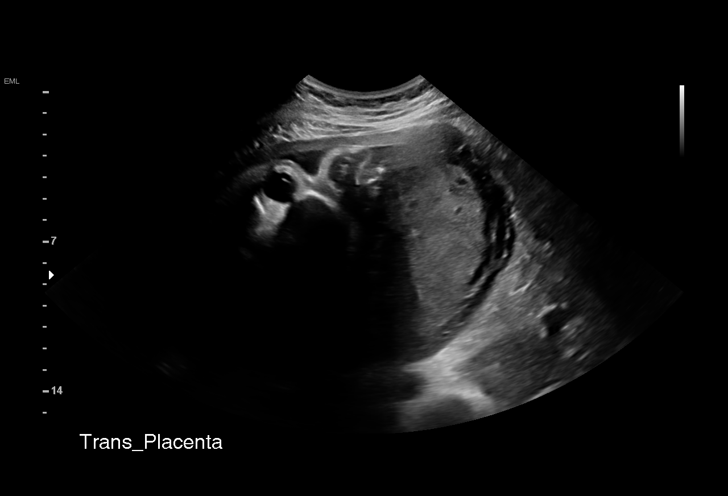
[im 11/56]
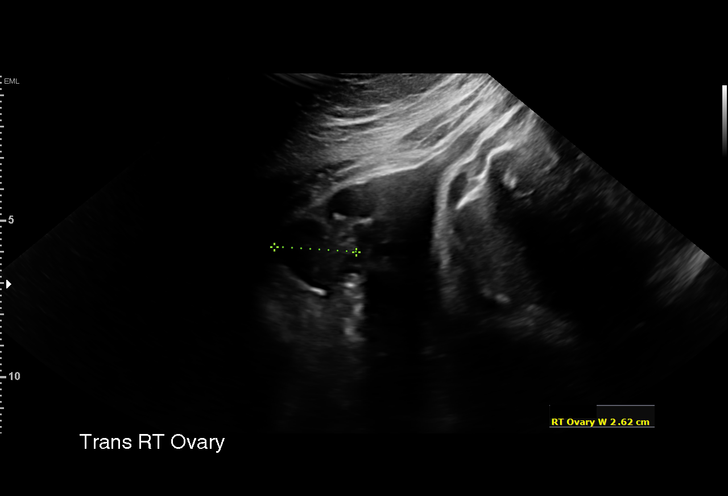
[im 15/56]
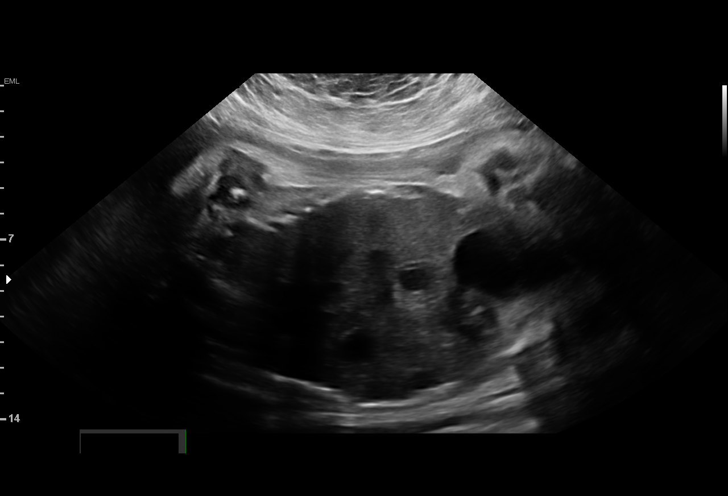
[im 19/56]
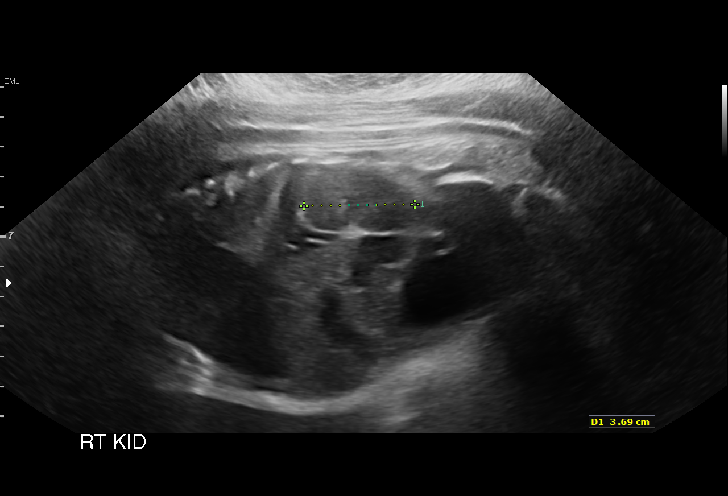
[im 23/56]
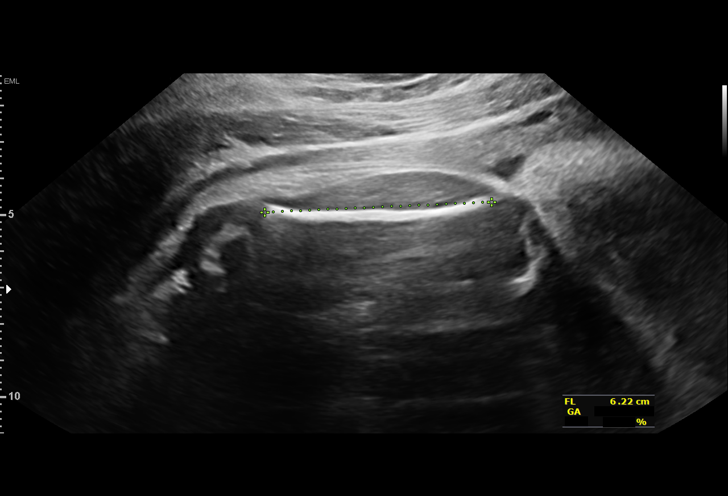
[im 29/56]
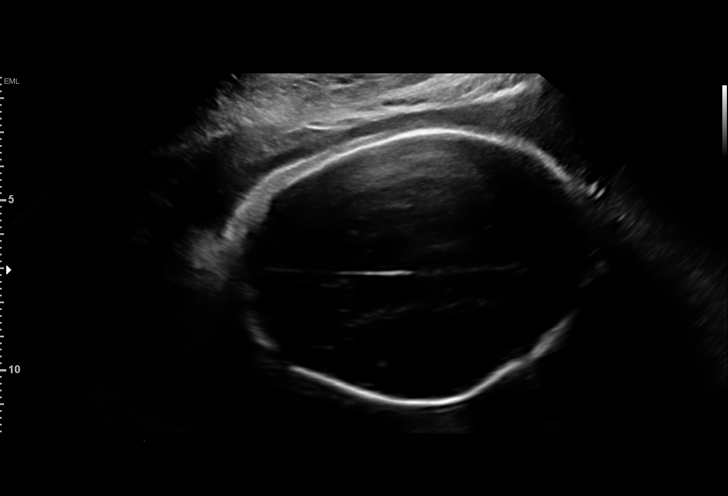
[im 33/56]
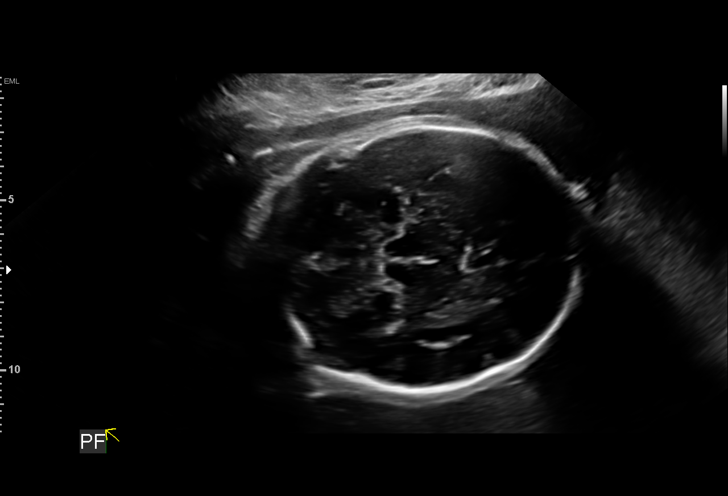
[im 37/56]
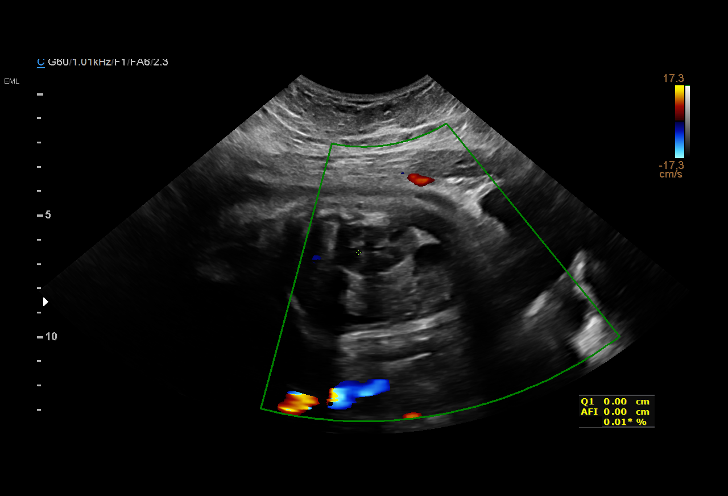
[im 41/56]
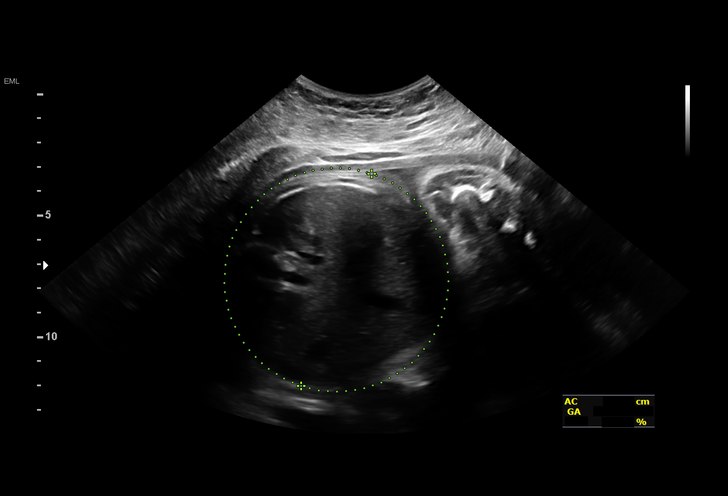
[im 45/56]
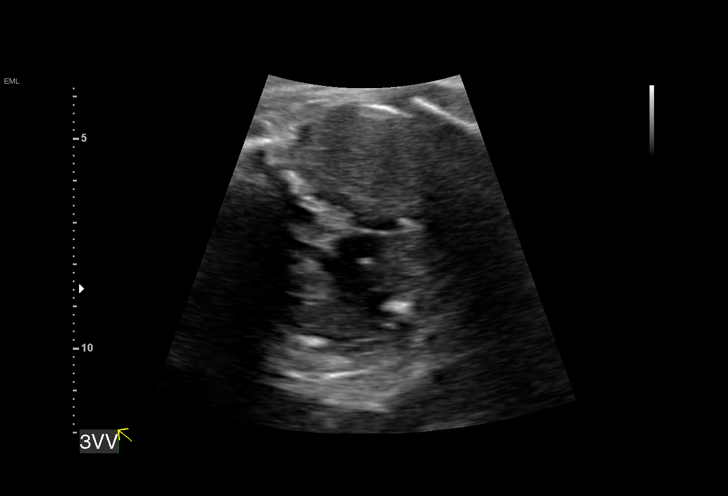
[im 49/56]
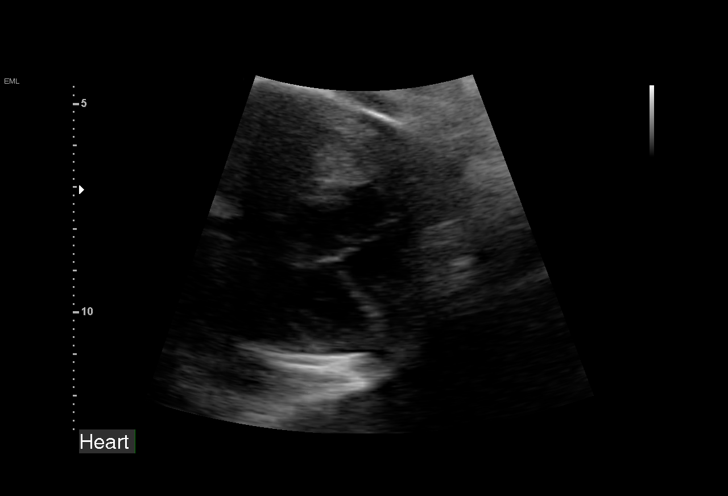
[im 53/56]
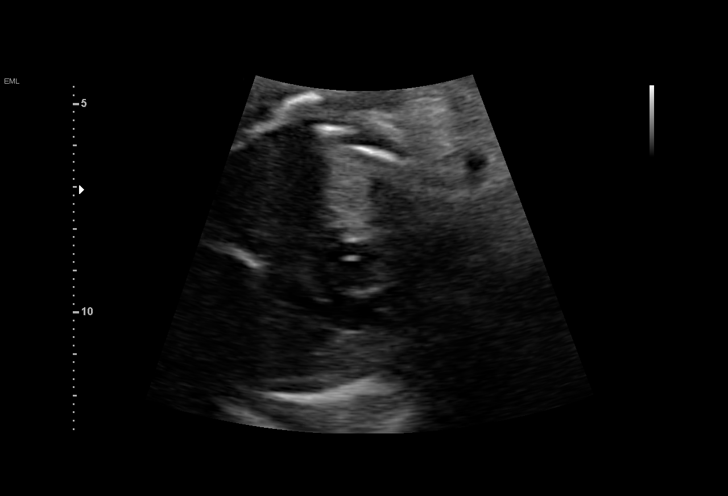

[13 of 28 positions shown; findings below may reference images not displayed]

----------------------------------------------------------------------

 ----------------------------------------------------------------------
Indications

  33 weeks gestation of pregnancy
  Hypothyroid
  Medical complication of pregnancy (PCOS)
  Obesity complicating pregnancy, second
  trimester(BMI 35)(No Genetic testing)
  Encounter for other antenatal screening
  follow-up
 ----------------------------------------------------------------------
Vital Signs

 BMI:
Fetal Evaluation

 Num Of Fetuses:         1
 Fetal Heart Rate(bpm):  144
 Cardiac Activity:       Observed
 Presentation:           Breech
 Placenta:               Left lateral
 P. Cord Insertion:      Visualized, central

 Amniotic Fluid
 AFI FV:      Within normal limits

 AFI Sum(cm)     %Tile       Largest Pocket(cm)
 9.94            17

 RUQ(cm)       RLQ(cm)       LUQ(cm)        LLQ(cm)
 0
Biometry
 BPD:      78.6  mm     G. Age:  31w 4d          8  %    CI:        71.75   %    70 - 86
                                                         FL/HC:      20.8   %    19.9 -
 HC:      295.4  mm     G. Age:  32w 4d          9  %    HC/AC:      1.02        0.96 -
 AC:      288.6  mm     G. Age:  32w 6d         43  %    FL/BPD:     78.2   %    71 - 87
 FL:       61.5  mm     G. Age:  31w 6d         12  %    FL/AC:      21.3   %    20 - 24
 HUM:      53.9  mm     G. Age:  31w 2d         23  %
 LV:        6.2  mm

 Est. FW:    9290  gm      4 lb 6 oz     45  %
OB History

 Gravidity:    1         Term:   0        Prem:   0        SAB:   0
 TOP:          0       Ectopic:  0        Living: 0
Gestational Age

 LMP:           33w 1d        Date:  10/14/17                 EDD:   07/21/18
 U/S Today:     32w 2d                                        EDD:   07/27/18
 Best:          33w 1d     Det. By:  LMP  (10/14/17)          EDD:   07/21/18
Anatomy

 Cranium:               Appears normal         Aortic Arch:            Previously seen
 Cavum:                 Appears normal         Ductal Arch:            Previously seen
 Ventricles:            Appears normal         Diaphragm:              Previously seen
 Choroid Plexus:        Appears normal         Stomach:                Appears normal, left
                                                                       sided
 Cerebellum:            Appears normal         Abdomen:                Previously seen
 Posterior Fossa:       Appears normal         Abdominal Wall:         Previously seen
 Nuchal Fold:           Previously seen        Cord Vessels:           Previously seen
 Face:                  Profile nl; orbits not Kidneys:                Appear normal
                        well visualized
 Lips:                  Previously seen        Bladder:                Appears normal
 Thoracic:              Appears normal         Spine:                  Previously seen
 Heart:                 Previously seen        Upper Extremities:      Previously seen
 RVOT:                  Not well visualized    Lower Extremities:      Previously seen
 LVOT:                  Appears normal;
                        3VV normal.

 Other:  Fetus appears to be female previously imaged. Heels visualized
         previously.
Cervix Uterus Adnexa

 Cervix
 Not visualized (advanced GA >54wks)

 Left Ovary
 Within normal limits.

 Right Ovary
 Within normal limits.

 Adnexa
 No abnormality visualized.
Impression

 Amniotic fluid is normal and good fetal activity is seen. Fetal
 growth is appropriate for gestational age.
Recommendations

 An appointment was made for her to return in 4 weeks for
 fetal growth assessment.
                 Gargaj, Jonild
